# Patient Record
Sex: Female | Born: 1955 | Race: White | Hispanic: No | Marital: Single | Smoking: Former smoker
Health system: Southern US, Community
[De-identification: ages and names within clinical notes are randomized; demographics above are authoritative.]

## PROBLEM LIST (undated history)

## (undated) DIAGNOSIS — R0789 Other chest pain: Secondary | ICD-10-CM

## (undated) DIAGNOSIS — E079 Disorder of thyroid, unspecified: Secondary | ICD-10-CM

## (undated) DIAGNOSIS — F32A Depression, unspecified: Secondary | ICD-10-CM

## (undated) DIAGNOSIS — M5020 Other cervical disc displacement, unspecified cervical region: Secondary | ICD-10-CM

## (undated) DIAGNOSIS — J309 Allergic rhinitis, unspecified: Secondary | ICD-10-CM

## (undated) DIAGNOSIS — E669 Obesity, unspecified: Secondary | ICD-10-CM

## (undated) DIAGNOSIS — R5382 Chronic fatigue, unspecified: Secondary | ICD-10-CM

## (undated) DIAGNOSIS — K9049 Malabsorption due to intolerance, not elsewhere classified: Secondary | ICD-10-CM

## (undated) DIAGNOSIS — G4721 Circadian rhythm sleep disorder, delayed sleep phase type: Secondary | ICD-10-CM

## (undated) DIAGNOSIS — K589 Irritable bowel syndrome without diarrhea: Secondary | ICD-10-CM

## (undated) DIAGNOSIS — K219 Gastro-esophageal reflux disease without esophagitis: Secondary | ICD-10-CM

## (undated) DIAGNOSIS — G9332 Myalgic encephalomyelitis/chronic fatigue syndrome: Secondary | ICD-10-CM

## (undated) DIAGNOSIS — F1910 Other psychoactive substance abuse, uncomplicated: Secondary | ICD-10-CM

## (undated) DIAGNOSIS — J8410 Pulmonary fibrosis, unspecified: Secondary | ICD-10-CM

## (undated) DIAGNOSIS — G4700 Insomnia, unspecified: Secondary | ICD-10-CM

## (undated) DIAGNOSIS — F988 Other specified behavioral and emotional disorders with onset usually occurring in childhood and adolescence: Secondary | ICD-10-CM

## (undated) DIAGNOSIS — I341 Nonrheumatic mitral (valve) prolapse: Secondary | ICD-10-CM

## (undated) DIAGNOSIS — T7840XA Allergy, unspecified, initial encounter: Secondary | ICD-10-CM

## (undated) DIAGNOSIS — J3089 Other allergic rhinitis: Secondary | ICD-10-CM

## (undated) DIAGNOSIS — E7800 Pure hypercholesterolemia, unspecified: Secondary | ICD-10-CM

## (undated) DIAGNOSIS — M25552 Pain in left hip: Secondary | ICD-10-CM

## (undated) DIAGNOSIS — N83209 Unspecified ovarian cyst, unspecified side: Secondary | ICD-10-CM

## (undated) DIAGNOSIS — M1990 Unspecified osteoarthritis, unspecified site: Secondary | ICD-10-CM

## (undated) DIAGNOSIS — R002 Palpitations: Secondary | ICD-10-CM

## (undated) DIAGNOSIS — D649 Anemia, unspecified: Secondary | ICD-10-CM

## (undated) DIAGNOSIS — F329 Major depressive disorder, single episode, unspecified: Secondary | ICD-10-CM

## (undated) DIAGNOSIS — M069 Rheumatoid arthritis, unspecified: Secondary | ICD-10-CM

## (undated) DIAGNOSIS — J45909 Unspecified asthma, uncomplicated: Secondary | ICD-10-CM

## (undated) DIAGNOSIS — A833 St Louis encephalitis: Secondary | ICD-10-CM

## (undated) DIAGNOSIS — M25542 Pain in joints of left hand: Secondary | ICD-10-CM

## (undated) DIAGNOSIS — E785 Hyperlipidemia, unspecified: Secondary | ICD-10-CM

## (undated) DIAGNOSIS — E039 Hypothyroidism, unspecified: Secondary | ICD-10-CM

## (undated) DIAGNOSIS — Z8249 Family history of ischemic heart disease and other diseases of the circulatory system: Secondary | ICD-10-CM

## (undated) DIAGNOSIS — M797 Fibromyalgia: Secondary | ICD-10-CM

## (undated) HISTORY — DX: Malabsorption due to intolerance, not elsewhere classified: K90.49

## (undated) HISTORY — DX: Hyperlipidemia, unspecified: E78.5

## (undated) HISTORY — DX: Circadian rhythm sleep disorder, delayed sleep phase type: G47.21

## (undated) HISTORY — DX: Allergy, unspecified, initial encounter: T78.40XA

## (undated) HISTORY — DX: Pulmonary fibrosis, unspecified: J84.10

## (undated) HISTORY — DX: Family history of ischemic heart disease and other diseases of the circulatory system: Z82.49

## (undated) HISTORY — DX: Irritable bowel syndrome, unspecified: K58.9

## (undated) HISTORY — PX: TONSILLECTOMY AND ADENOIDECTOMY: SUR1326

## (undated) HISTORY — DX: Anemia, unspecified: D64.9

## (undated) HISTORY — DX: Other specified behavioral and emotional disorders with onset usually occurring in childhood and adolescence: F98.8

## (undated) HISTORY — DX: Chronic fatigue, unspecified: R53.82

## (undated) HISTORY — DX: Palpitations: R00.2

## (undated) HISTORY — DX: Other chest pain: R07.89

## (undated) HISTORY — DX: Obesity, unspecified: E66.9

## (undated) HISTORY — DX: Myalgic encephalomyelitis/chronic fatigue syndrome: G93.32

## (undated) HISTORY — DX: Other cervical disc displacement, unspecified cervical region: M50.20

## (undated) HISTORY — DX: Rheumatoid arthritis, unspecified: M06.9

## (undated) HISTORY — DX: Unspecified asthma, uncomplicated: J45.909

## (undated) HISTORY — DX: Other psychoactive substance abuse, uncomplicated: F19.10

## (undated) HISTORY — DX: Disorder of thyroid, unspecified: E07.9

## (undated) HISTORY — DX: Depression, unspecified: F32.A

## (undated) HISTORY — DX: Pain in joints of left hand: M25.542

## (undated) HISTORY — PX: LIPOMA EXCISION: SHX5283

## (undated) HISTORY — DX: Unspecified ovarian cyst, unspecified side: N83.209

## (undated) HISTORY — DX: Fibromyalgia: M79.7

## (undated) HISTORY — DX: Nonrheumatic mitral (valve) prolapse: I34.1

## (undated) HISTORY — DX: Major depressive disorder, single episode, unspecified: F32.9

## (undated) HISTORY — PX: OTHER SURGICAL HISTORY: SHX169

## (undated) HISTORY — DX: Unspecified osteoarthritis, unspecified site: M19.90

## (undated) HISTORY — DX: Gastro-esophageal reflux disease without esophagitis: K21.9

## (undated) HISTORY — DX: Other allergic rhinitis: J30.89

## (undated) HISTORY — DX: St Louis encephalitis: A83.3

## (undated) HISTORY — DX: Pure hypercholesterolemia, unspecified: E78.00

## (undated) HISTORY — DX: Pain in left hip: M25.552

## (undated) HISTORY — DX: Allergic rhinitis, unspecified: J30.9

## (undated) HISTORY — DX: Hypothyroidism, unspecified: E03.9

## (undated) HISTORY — PX: CHOLECYSTECTOMY: SHX55

## (undated) HISTORY — DX: Insomnia, unspecified: G47.00

---

## 1997-11-02 ENCOUNTER — Emergency Department (HOSPITAL_COMMUNITY): Admission: EM | Admit: 1997-11-02 | Discharge: 1997-11-02 | Payer: Self-pay | Admitting: Emergency Medicine

## 1999-05-06 ENCOUNTER — Other Ambulatory Visit: Admission: RE | Admit: 1999-05-06 | Discharge: 1999-05-06 | Payer: Self-pay | Admitting: Family Medicine

## 2001-07-27 ENCOUNTER — Ambulatory Visit (HOSPITAL_COMMUNITY): Admission: RE | Admit: 2001-07-27 | Discharge: 2001-07-27 | Payer: Self-pay | Admitting: Internal Medicine

## 2001-09-26 ENCOUNTER — Other Ambulatory Visit: Admission: RE | Admit: 2001-09-26 | Discharge: 2001-09-26 | Payer: Self-pay | Admitting: Obstetrics and Gynecology

## 2001-10-11 ENCOUNTER — Other Ambulatory Visit: Admission: RE | Admit: 2001-10-11 | Discharge: 2001-10-11 | Payer: Self-pay | Admitting: Obstetrics and Gynecology

## 2002-01-18 ENCOUNTER — Encounter: Admission: RE | Admit: 2002-01-18 | Discharge: 2002-01-18 | Payer: Self-pay | Admitting: Family Medicine

## 2002-01-18 ENCOUNTER — Encounter: Payer: Self-pay | Admitting: Family Medicine

## 2002-02-12 ENCOUNTER — Encounter: Payer: Self-pay | Admitting: Gastroenterology

## 2002-02-12 ENCOUNTER — Encounter: Admission: RE | Admit: 2002-02-12 | Discharge: 2002-02-12 | Payer: Self-pay | Admitting: Gastroenterology

## 2002-02-18 ENCOUNTER — Encounter: Payer: Self-pay | Admitting: Gastroenterology

## 2002-02-18 ENCOUNTER — Encounter: Admission: RE | Admit: 2002-02-18 | Discharge: 2002-02-18 | Payer: Self-pay | Admitting: Gastroenterology

## 2002-04-02 ENCOUNTER — Ambulatory Visit (HOSPITAL_COMMUNITY): Admission: RE | Admit: 2002-04-02 | Discharge: 2002-04-02 | Payer: Self-pay | Admitting: Gastroenterology

## 2003-02-11 ENCOUNTER — Other Ambulatory Visit: Admission: RE | Admit: 2003-02-11 | Discharge: 2003-02-11 | Payer: Self-pay | Admitting: Obstetrics and Gynecology

## 2003-02-26 ENCOUNTER — Ambulatory Visit (HOSPITAL_COMMUNITY): Admission: RE | Admit: 2003-02-26 | Discharge: 2003-02-26 | Payer: Self-pay | Admitting: Internal Medicine

## 2003-04-24 ENCOUNTER — Ambulatory Visit (HOSPITAL_COMMUNITY): Admission: RE | Admit: 2003-04-24 | Discharge: 2003-04-24 | Payer: Self-pay | Admitting: Gastroenterology

## 2003-06-27 ENCOUNTER — Encounter: Admission: RE | Admit: 2003-06-27 | Discharge: 2003-06-27 | Payer: Self-pay | Admitting: Family Medicine

## 2004-01-09 ENCOUNTER — Encounter: Admission: RE | Admit: 2004-01-09 | Discharge: 2004-01-09 | Payer: Self-pay | Admitting: Gastroenterology

## 2004-02-03 ENCOUNTER — Observation Stay (HOSPITAL_COMMUNITY): Admission: RE | Admit: 2004-02-03 | Discharge: 2004-02-04 | Payer: Self-pay | Admitting: Surgery

## 2004-04-26 ENCOUNTER — Ambulatory Visit: Payer: Self-pay | Admitting: Pulmonary Disease

## 2004-06-22 ENCOUNTER — Encounter: Admission: RE | Admit: 2004-06-22 | Discharge: 2004-06-22 | Payer: Self-pay

## 2004-06-28 ENCOUNTER — Ambulatory Visit: Payer: Self-pay | Admitting: Pulmonary Disease

## 2004-10-06 ENCOUNTER — Ambulatory Visit: Payer: Self-pay | Admitting: Pulmonary Disease

## 2004-10-25 ENCOUNTER — Ambulatory Visit: Payer: Self-pay | Admitting: Pulmonary Disease

## 2004-12-10 ENCOUNTER — Ambulatory Visit: Payer: Self-pay | Admitting: Pulmonary Disease

## 2005-05-25 ENCOUNTER — Ambulatory Visit: Payer: Self-pay | Admitting: Internal Medicine

## 2005-06-24 ENCOUNTER — Ambulatory Visit: Payer: Self-pay | Admitting: Pulmonary Disease

## 2005-08-09 ENCOUNTER — Ambulatory Visit: Payer: Self-pay | Admitting: Pulmonary Disease

## 2005-12-22 ENCOUNTER — Ambulatory Visit: Payer: Self-pay | Admitting: Pulmonary Disease

## 2010-06-12 ENCOUNTER — Encounter: Payer: Self-pay | Admitting: Family Medicine

## 2011-11-03 ENCOUNTER — Other Ambulatory Visit: Payer: Self-pay | Admitting: Gynecology

## 2011-11-03 DIAGNOSIS — Z1231 Encounter for screening mammogram for malignant neoplasm of breast: Secondary | ICD-10-CM

## 2011-11-17 ENCOUNTER — Ambulatory Visit
Admission: RE | Admit: 2011-11-17 | Discharge: 2011-11-17 | Disposition: A | Discharge status: Home / Self Care | Payer: Medicare Other | Source: Ambulatory Visit | Attending: Gynecology | Admitting: Gynecology

## 2011-11-17 DIAGNOSIS — Z1231 Encounter for screening mammogram for malignant neoplasm of breast: Secondary | ICD-10-CM

## 2014-01-14 ENCOUNTER — Ambulatory Visit: Payer: Medicare Other | Admitting: Cardiology

## 2014-02-19 ENCOUNTER — Encounter: Payer: Self-pay | Admitting: Cardiology

## 2014-02-19 ENCOUNTER — Ambulatory Visit (INDEPENDENT_AMBULATORY_CARE_PROVIDER_SITE_OTHER): Payer: Medicare Other | Admitting: Cardiology

## 2014-02-19 ENCOUNTER — Ambulatory Visit (INDEPENDENT_AMBULATORY_CARE_PROVIDER_SITE_OTHER)
Admission: RE | Admit: 2014-02-19 | Discharge: 2014-02-19 | Disposition: A | Discharge status: Home / Self Care | Payer: Self-pay | Source: Ambulatory Visit | Attending: Cardiology | Admitting: Cardiology

## 2014-02-19 VITALS — BP 100/80 | HR 84 | Ht 66.0 in | Wt 191.0 lb

## 2014-02-19 DIAGNOSIS — R002 Palpitations: Secondary | ICD-10-CM

## 2014-02-19 DIAGNOSIS — R0789 Other chest pain: Secondary | ICD-10-CM

## 2014-02-19 DIAGNOSIS — Z8249 Family history of ischemic heart disease and other diseases of the circulatory system: Secondary | ICD-10-CM

## 2014-02-19 DIAGNOSIS — E7800 Pure hypercholesterolemia, unspecified: Secondary | ICD-10-CM

## 2014-02-19 DIAGNOSIS — E669 Obesity, unspecified: Secondary | ICD-10-CM

## 2014-02-19 HISTORY — DX: Family history of ischemic heart disease and other diseases of the circulatory system: Z82.49

## 2014-02-19 HISTORY — DX: Other chest pain: R07.89

## 2014-02-19 HISTORY — DX: Obesity, unspecified: E66.9

## 2014-02-19 HISTORY — DX: Pure hypercholesterolemia, unspecified: E78.00

## 2014-02-19 HISTORY — DX: Palpitations: R00.2

## 2014-02-19 NOTE — Patient Instructions (Signed)
The current medical regimen is effective;  continue present plan and medications.  Your physician has requested that you have Calcium score. Computed tomography (CT) is a painless test that uses an x-ray machine to take clear, detailed pictures of your heart. For further information please visit [email or web address]. Please follow instruction sheet as given.  Further follow will be based on these results.

## 2014-02-19 NOTE — Progress Notes (Signed)
[address] [place], [place]  [address] Phone: [phone] Fax:  [phone]  Date:  02/19/2014   ID:  Colon FlatteryKimberly K Munoz, DOB 08-28-1955, MRN [id number]  PCP:  Pearson GrippeKIM, JAMES, MD   History of Present Illness: Theresa Munoz is a 58 y.o. female here for evaluation of strong family history. Her brother had recent myocardial infarction and is undergoing bypass surgery. She has had chest pain off and on for several years. At one point she was diagnosed with mitral valve prolapse syndrome but most recent evaluation did not demonstrate any evidence of mitral valve prolapse. She was also diagnosed previously with esophageal spasm. She does have occasional atypical chest discomfort with short bursts of sharp chest discomfort that lasts a few seconds duration then releases. These do not seem to be associated with exertional activity.  She has been under increased stress recently taking care of her mother who has Alzheimer's. [facility] has [facility]. This is been a challenge.     Wt Readings from Last 3 Encounters:  02/19/14 191 lb (86.637 kg)     No past medical history on file. 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  Current Outpatient Prescriptions  Medication Sig Dispense Refill  . ARMOUR THYROID 30 MG tablet Take 60 mg by mouth daily before breakfast.       . Cholecalciferol (VITAMIN D3) 2000 UNITS capsule Take 2,000 Units by mouth daily.      . CONCERTA 54 MG CR tablet       . diphenhydrAMINE (SOMINEX) 25 MG tablet Take 25 mg by mouth at bedtime as needed for sleep.      Marland Kitchen. estradiol (ESTRACE) 0.1 MG/GM vaginal cream Place 1 Applicatorful vaginally at bedtime.      . fexofenadine (ALLEGRA) 180 MG tablet Take 180 mg by mouth daily.      Marland Kitchen. FLUoxetine (PROZAC) 20 MG capsule Take 40 mg by mouth daily.       Marland Kitchen. GINKGO BILOBA PO Take by mouth. Ginkgo  Biloba with Vinpocetine      . Guaifenesin (MUCINEX MAXIMUM STRENGTH) 1200 MG TB12 Take 1,200 mg by mouth 2 (two) times daily.      Marland Kitchen. L-Methylfolate 15 MG TABS Take 15 mg by mouth daily.      Marland Kitchen. levETIRAcetam (KEPPRA) 750 MG tablet Take 1,500 mg by mouth 2 (two) times daily.       . Magnesium 400 MG TABS Take 500 mg by mouth daily.      . Melatonin 3 MG CAPS Take 3 mg by mouth as needed.      . meloxicam (MOBIC) 7.5 MG tablet Take 7.5 mg by mouth 2 (two) times daily.      . methylphenidate 27 MG PO CR tablet Take 27 mg by mouth daily.      . Misc Natural Products (GRAPE SEED COMPLEX PO) Take by mouth. Grape Seed & Resveratrol      . mometasone (NASONEX) 50 MCG/ACT nasal spray Place 2 sprays into the nose 2 (two) times daily.      . montelukast (SINGULAIR) 10 MG tablet Take 10 mg by mouth as needed.      Marland Kitchen. NEXIUM 40 MG capsule Take 80 mg by mouth 2 (two) times daily before a meal.       . Probiotic Product (ALIGN) 4 MG CAPS Take 4 mg by mouth daily.      .Marland Kitchen  riboflavin (VITAMIN B-2) 100 MG TABS tablet Take 100 mg by mouth daily.      . Tiotropium Bromide Monohydrate (SPIRIVA RESPIMAT) 2.5 MCG/ACT AERS Inhale into the lungs as needed.      . Tretinoin, Facial Wrinkles, (TRETINOIN, EMOLLIENT,) 0.05 % CREA Apply topically.      Marland Kitchen zolpidem (AMBIEN) 10 MG tablet Take 5 mg by mouth at bedtime as needed for sleep.       No current facility-administered medications for this visit.    Allergies:    Allergies  Allergen Reactions  . Polymyxin B   . Trazodone And Nefazodone     Social History:  The patient  reports that she has quit smoking. She does not have any smokeless tobacco history on file.   No family history on file. as described above in history of present illness. Brother MI  ROS:  Please see the history of present illness.   Denies any fevers, chills, orthopnea, PND, syncope, bleeding, exertional chest pain, shortness of breath. She is on several dietary supplementation pills.   All other  systems reviewed and negative.   PHYSICAL EXAM: VS:  BP 100/80  Pulse 84  Ht 5\' 6"  (1.676 m)  Wt 191 lb (86.637 kg)  BMI 30.84 kg/m2 Well nourished, well developed, in no acute distress HEENT: normal, [place]/AT, EOMI Neck: no JVD, normal carotid upstroke, no bruit Cardiac:  normal S1, S2; RRR; soft systolic murmur Lungs:  clear to auscultation bilaterally, no wheezing, rhonchi or rales Abd: soft, nontender, no hepatomegaly, no bruitsoverweight Ext: no edema, 2+ distal pulses Skin: warm and dry GU: deferred Neuro: no focal abnormalities noted, AAO x 3  EKG:  Normal sinus rhythm heart rate 77 with no other abnormalities. Lab work, prior medical records reviewed.  ASSESSMENT AND PLAN:  1. Family history of coronary artery disease-she is concerned especially with her brothers history of bypass surgery today. We will go ahead and provide her with CT coronary calcium score. 2. Hyperlipidemia-total cholesterol currently to 29, HDL 53, LDL 160. Previous LDL 139. TSH is normal at 1.3, hemoglobin 12.7, creatinine 0.9. Cholesterol has increased slightly over the past few months. This is going to be rechecked in 6 months by Dr. Selena Batten. She is not currently in the familial hyperlipidemia range, LDL greater than 190. We discussed at length dietary modifications, exercise. If calcium score is significantly elevated, we may proceed with stress test and/or encouragement of statin therapy. She did relate to me that she has not highly interested in taking statins and as long as she does not have any high risk objective evidence, I am fine with her continuing with her dietary modification. Her current chest discomfort is very atypical. 3. Palpitations-very rare. No complaints currently. 4. We will see her back in one year. I will discuss with her the results of her calcium score.  Signed, Donato Schultz, MD [facility]  02/19/2014 5:16 PM

## 2014-02-20 ENCOUNTER — Telehealth: Payer: Self-pay | Admitting: Interventional Cardiology

## 2014-02-20 ENCOUNTER — Telehealth: Payer: Self-pay | Admitting: Cardiology

## 2014-02-20 NOTE — Telephone Encounter (Signed)
Walk In pt form " [facility]" Dropped Off gave to Amy  10.1.15/km

## 2014-02-20 NOTE — Telephone Encounter (Signed)
Correction the Walk in pt For Not Given to Amy These are For Pam she returns 10/5   10.1.15/km

## 2014-11-27 IMAGING — CT CT HEART SCORING
1 of 3 series · 10 of 20 positions shown, 13 images · non-contrast
Comparison: None.

CLINICAL DATA: Risk stratification

EXAM:
Coronary Calcium Score
TECHNIQUE: The patient was scanned on a Siemens Sensation 16 slice scanner.
Axial non-contrast 3mm slices were carried out through the heart.
The data set was analyzed on a dedicated work station and scored
using the Agatson method.
Axial non-contrast 3 mm slices were carried out through the heart.

[Series 6: st thins for reformat · axial · 0.64mm/px · z∈[-227,-125]mm · 10 of 126 slices shown, 13 images]
[im 12/126  vessel]
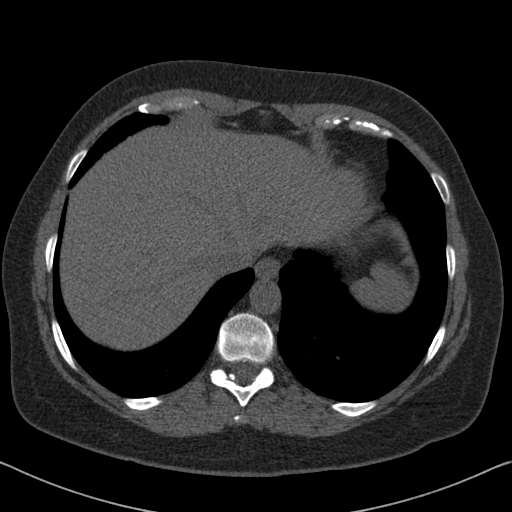
[im 12/126  lung]
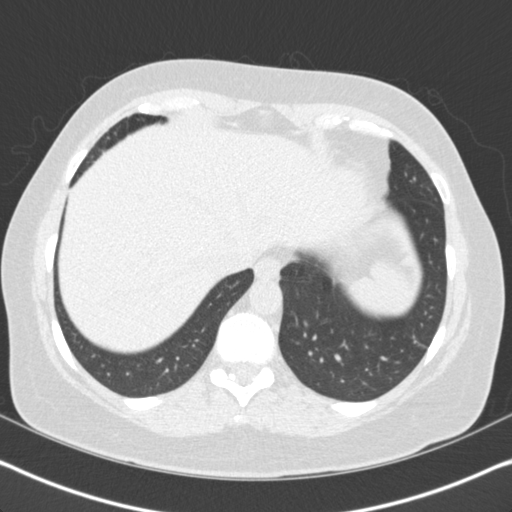
[im 23/126  vessel]
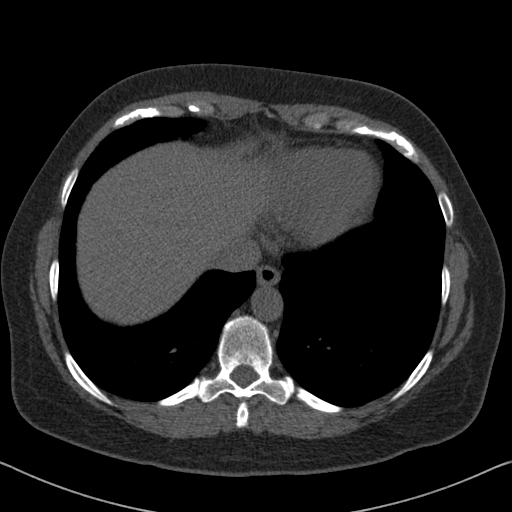
[im 35/126  vessel]
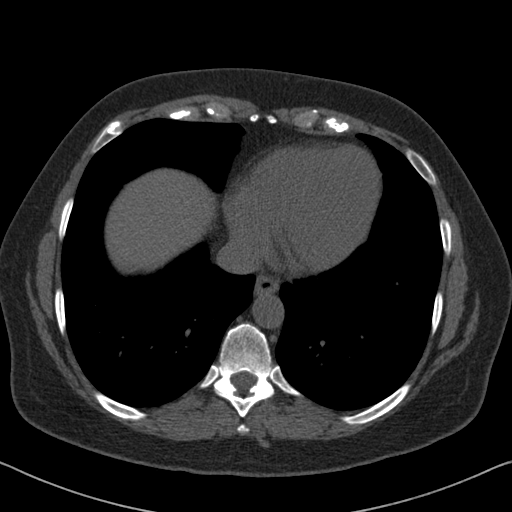
[im 46/126  vessel]
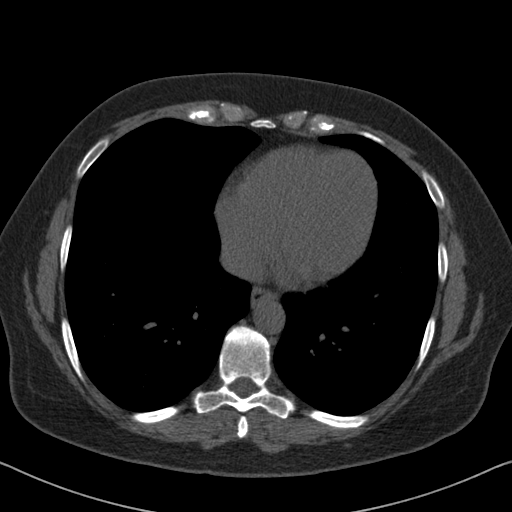
[im 57/126  vessel]
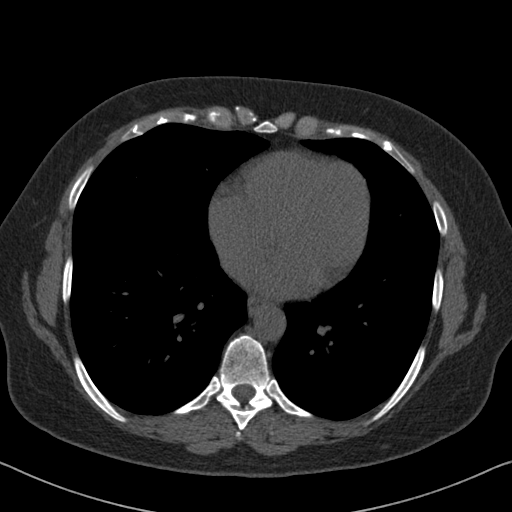
[im 57/126  lung]
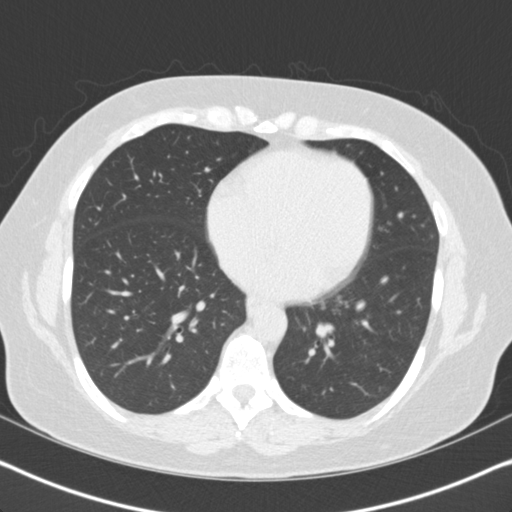
[im 69/126  vessel]
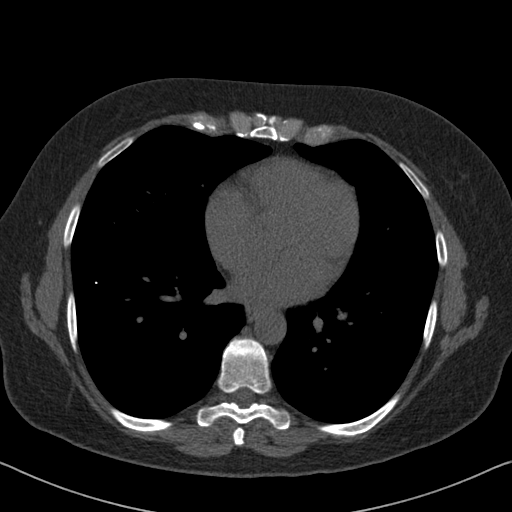
[im 80/126  vessel]
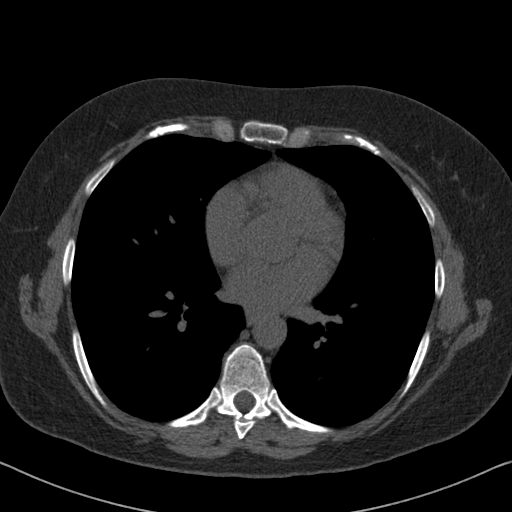
[im 91/126  vessel]
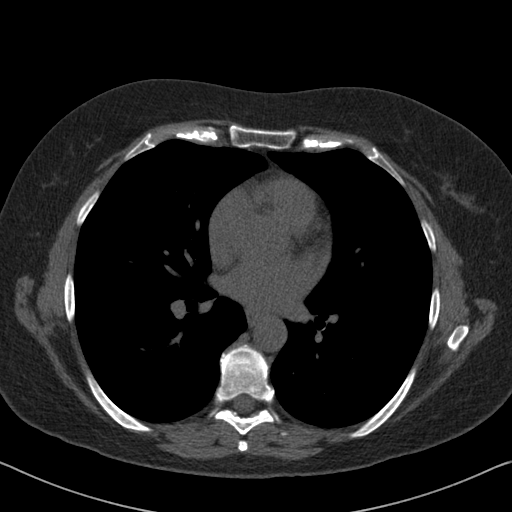
[im 103/126  vessel]
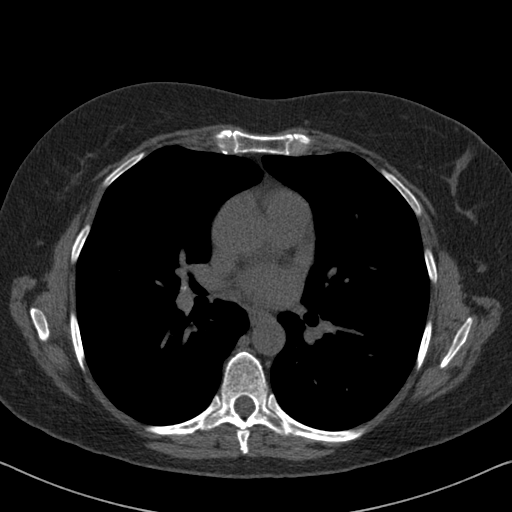
[im 103/126  lung]
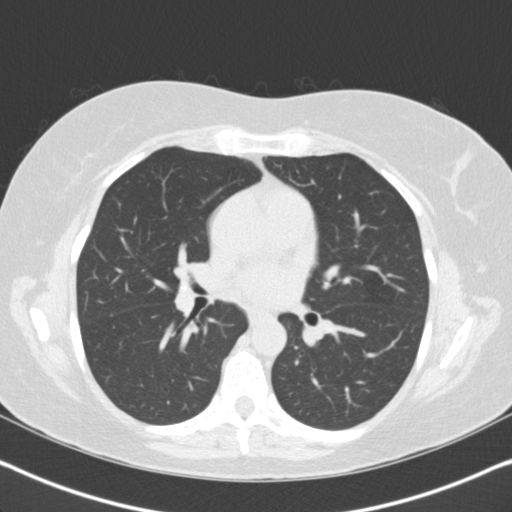
[im 114/126  vessel]
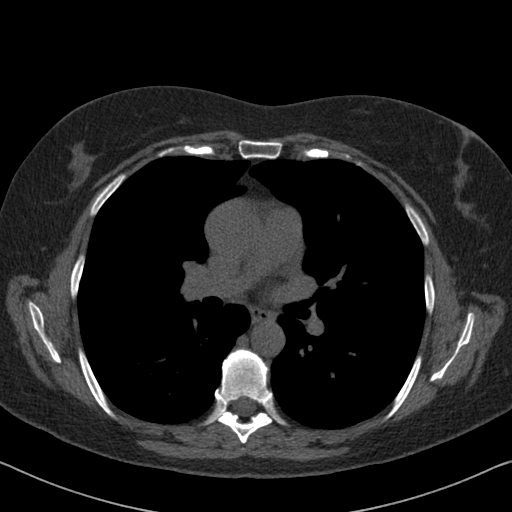

[10 of 20 positions shown; findings below may reference images not displayed]

FINDINGS: Non-cardiac: No significant non cardiac findings on limited lung and
soft tissue windows. See separate report from [REDACTED].

Ascending Aorta:  3.5 cm

Pericardium: Normal

Coronary arteries:  No coronary calcium
IMPRESSION: Coronary calcium score of 0.

[REDACTED]AL DATA:  Risk stratification

EXAM:
Coronary Calcium Score
FINDINGS: Non-cardiac: See separate report from [REDACTED].

Ascending Aorta:  Normal size.

Pericardium:  Normal, no pericardial effusion.

Coronary arteries:  Originating in normal position.
IMPRESSION: Coronary calcium score of 0. This was 0 percentile for age and sex
matched control.

Nisam Bihar

EXAM:
OVER-READ INTERPRETATION  CT CHEST

The following report is an over-read performed by radiologist Dr.
over-read does not include interpretation of cardiac or coronary
anatomy or pathology. The coronary calcium score/coronary CTA
interpretation by the cardiologist is attached.
FINDINGS: Mediastinum: No enlarged mediastinal or hilar lymph nodes. The
visualized portion of the esophagus appear normal.

Lungs/Pleura: Calcified granuloma identified within the right lower
lobe. Noncalcified right lower lobe nodule is noted measure 6 mm. No
evidence for pleural effusion.

Upper Abdomen: The visualized portions of the spleen and liver are
unremarkable.

Musculoskeletal: Review of the visualized osseous structures is
unremarkable
IMPRESSION: 1. Noncalcified nodule within the right lower lobe measures 6 mm. If
the patient is at high risk for bronchogenic carcinoma, follow-up
chest CT at 6-12 months is recommended. If the patient is at low
risk for bronchogenic carcinoma, follow-up chest CT at 12 months is
recommended. This recommendation follows the consensus statement:
Guidelines for Management of Small Pulmonary Nodules Detected on CT
Scans: A Statement from the [HOSPITAL] as published in
2. Prior granulomatous disease.

## 2015-04-01 ENCOUNTER — Ambulatory Visit: Payer: Medicare Other | Attending: Orthopaedic Surgery | Admitting: Physical Therapy

## 2015-04-01 ENCOUNTER — Encounter: Payer: Self-pay | Admitting: Physical Therapy

## 2015-04-01 DIAGNOSIS — M5010 Cervical disc disorder with radiculopathy, unspecified cervical region: Secondary | ICD-10-CM

## 2015-04-01 DIAGNOSIS — M542 Cervicalgia: Secondary | ICD-10-CM

## 2015-04-01 NOTE — Therapy (Signed)
[facility] [address] [place], [place], [address] Phone: [phone]   Fax:  [phone]  Physical Therapy Evaluation  Patient Details  Name: Theresa SchoonerKimberly K Ojala MRN: [id number] Date of Birth: [birth year] Referring Provider: Dr. Loraine LericheMark yates  Encounter Date: 04/01/2015      PT End of Session - 04/01/15 1529    Visit Number 1   Number of Visits 10  Medicare   Date for PT Re-Evaluation 05/13/15   PT Start Time 1500   PT Stop Time 1530   PT Time Calculation (min) 30 min   Activity Tolerance Patient tolerated treatment well   Behavior [facility] for tasks assessed/performed      Past Medical History  Diagnosis Date  . Allergy   . Thyroid disease   . Asthma   . Anemia   . Depression   . Fibromyalgia     History reviewed. No pertinent past surgical history.  There were no vitals filed for this visit.  Visit Diagnosis:  Cervical pain (neck) - Plan: PT plan of care cert/re-cert  Cervical disc disorder with radiculopathy of cervical region - Plan: PT plan of care cert/re-cert      Subjective Assessment - 04/01/15 1507    Subjective Patient reports in 1998 fell and herniated disc in neck. Patient has had chronic neck pain since then. Patient reports last 3 months she is having a flare-up in cervical.  Patient reports radiating pain in left hand and some in right.    Limitations Sitting   How long can you sit comfortably? 30 min.    Diagnostic tests x-ray showed spondylosis with osteophyte   Currently in Pain? Yes   Pain Score 4    Pain Location Neck   Pain Orientation Left   Pain Descriptors / Indicators Spasm   Pain Type Chronic pain   Pain Radiating Towards radiates into left arm, numbness/tingling both hands   Pain Onset More than a month ago   Pain Frequency Intermittent   Aggravating Factors  driving; sitting on a hard chair, looking upward/downward   Pain Relieving Factors medication   Multiple Pain Sites No            [facility] PT Assessment - 04/01/15 0001    Assessment   Medical Diagnosis Cervical spondylosis, neck pain, left UE radiculopathy   Referring Provider Dr. Loraine LericheMark yates   Onset Date/Surgical Date 01/22/15   Hand Dominance Right   Prior Therapy yes-traction  over the door traction unit increased her TMJ pain   Precautions   Precautions None   Balance Screen   Has the patient fallen in the past 6 months No   Has the patient had a decrease in activity level because of a fear of falling?  No   Is the patient reluctant to leave their home because of a fear of falling?  No   Prior Function   Level of Independence Independent with basic ADLs   Cognition   Overall Cognitive Status Within Functional Limits for tasks assessed   Observation/Other Assessments   Focus on Therapeutic Outcomes (FOTO)  53% limitation CK  goal is 42% limitation CK   ROM / Strength   AROM / PROM / Strength AROM   AROM   AROM Assessment Site Cervical   Cervical Flexion full   Cervical Extension decreased by 75%   Cervical - Right Side Bend decreased by 50%   Cervical - Left Side Bend decreased by 50%  Cervical - Right Rotation decreased by 25%   Cervical - Left Rotation decreased by 25%   Palpation   SI assessment  decreased mobility of C3-C7   Palpation comment palpable tenderness located in cervical paraspinals, bil. upper trapezius, bil. scalenes, anterior cervical musculature                             PT Short Term Goals - 2015/04/27 1524    PT SHORT TERM GOAL #1   Title mild exercise for 10 min 3 days per week   Time 3   Period Weeks   Status New   PT SHORT TERM GOAL #2   Title sit for 30 min in a car to travel with moderate pain   Time 3   Period Weeks   Status New           PT Long Term Goals - 04/27/2015 1521    PT LONG TERM GOAL #1   Title independent with HEP    Time 6   Period Weeks   Status New   PT LONG TERM GOAL #2   Title  sit in a car to travel for 1 hour with moderate pain   Time 6   Status New   PT LONG TERM GOAL #3   Title understand ways to manage her cervical pain with correct posture   Time 6   Period Weeks   Status New   PT LONG TERM GOAL #4   Title return to regulary mild exercise for 3 days per week for 20 min   Time 6   Period Weeks   Status New   PT LONG TERM GOAL #5   Title pain decreased to moderate consistently withdaily activities   Time 6   Period Weeks   Status New               Plan - 2015-04-27 1609    Clinical Impression Statement Patient is a 59 year old  female with diagnosis of cervical spondylosis, Neck pain, left UE radiculopathy.  Patient has a history of chronic fatique syndrome and fibromyalgia.  FOTO score is 53% limitation.  Cervical ROM deficits: bilateral rotation decreased by 25%, bilateral sidebending decreased by 50%, and extesnion decreased by 25%.  Palpable tenderness located in scalenes, cervical musculature, and suboccipitals.  decreased mobilty of C3-T1.  Patient reports her cervical pain is 4/10 that radiated inot left UE and tingling in bilateral hands.     Pt will benefit from skilled therapeutic intervention in order to improve on the following deficits Decreased range of motion;Increased fascial restricitons;Pain;Increased muscle spasms;Decreased endurance;Decreased activity tolerance;Impaired flexibility;Decreased mobility;Decreased strength   Rehab Potential Good   Clinical Impairments Affecting Rehab Potential None   PT Frequency 2x / week   PT Duration 6 weeks   PT Treatment/Interventions Other (comment);ADLs/Self Care Home Management;Cryotherapy;Electrical Stimulation;Moist Heat;Therapeutic exercise;Therapeutic activities;Ultrasound;Neuromuscular re-education;Patient/family education;Manual techniques;Passive range of motion;Traction  home TENS unit,    PT Next Visit Plan cervical traction, soft tissue work, modalities, posture   PT Home Exercise  Plan Cervical ROM exercises   Recommended Other Services None   Consulted and Agree with Plan of Care Patient          G-Codes - Apr 27, 2015 1530    Functional Assessment Tool Used FOTO 53% limitation CK  goal is 42% limitation CK   Functional Limitation Changing and maintaining body position   Changing and Maintaining Body Position Current Status (Z6109)  At least 40 percent but less than 60 percent impaired, limited or restricted   Changing and Maintaining Body Position Goal Status (Z6109) At least 40 percent but less than 60 percent impaired, limited or restricted       Problem List Patient Active Problem List   Diagnosis Date Noted  . Atypical chest pain 02/19/2014  . Family history of ischemic heart disease 02/19/2014  . Pure hypercholesterolemia 02/19/2014  . Palpitations 02/19/2014  . Obesity, unspecified 02/19/2014    Kees Idrovo,PT 04/01/2015, 4:16 PM  [facility] [address], [address] [place], [place], [address] Phone: [phone]   Fax:  [phone]  Name: JERIANNE ANSELMO MRN: [id number] Date of Birth: 07/23/1955

## 2015-04-02 ENCOUNTER — Encounter: Payer: Self-pay | Admitting: Physical Therapy

## 2015-04-02 ENCOUNTER — Ambulatory Visit: Payer: Medicare Other | Admitting: Physical Therapy

## 2015-04-02 DIAGNOSIS — M542 Cervicalgia: Secondary | ICD-10-CM | POA: Diagnosis not present

## 2015-04-02 DIAGNOSIS — M5010 Cervical disc disorder with radiculopathy, unspecified cervical region: Secondary | ICD-10-CM

## 2015-04-02 NOTE — Patient Instructions (Signed)
AROM: Neck Rotation    Turn head slowly to look over one shoulder, then the other. Hold each position _5___ seconds. Repeat ___3_ times per set. Do __1__ sets per session. Do 2____ sessions per day. Keep chest upward http://orth.exer.us/294   Copyright  VHI. All rights reserved.  AROM: Lateral Neck Flexion    Slowly tilt head toward one shoulder, then the other. Hold each position __5__ seconds. Repeat _3___ times per set. Do __1__ sets per session. Do __2__ sessions per day.  http://orth.exer.us/296   Copyright  VHI. All rights reserved.  AROM: Neck Extension    Bend head backward. Hold __2__ seconds. Repeat __3__ times per set. Do __1__ sets per session. Do __2__ sessions per day.  http://orth.exer.us/300   Copyright  VHI. All rights reserved.  [facility] [address], [address] [place], [place] [address] Phone # [phone] Fax [phone]

## 2015-04-02 NOTE — Therapy (Signed)
[facility] [address], [address] [place], [place], [address] Phone: [phone]   Fax:  [phone]  Physical Therapy Treatment  Patient Details  Name: Theresa Munoz MRN: [id number] Date of Birth: Jun 12, 1955 Referring Provider: Dr. Loraine Leriche yates  Encounter Date: 04/02/2015      PT End of Session - 04/02/15 1420    Visit Number 2   Number of Visits 10  Medicare   Date for PT Re-Evaluation 05/13/15   PT Start Time 1400   PT Stop Time 1445   PT Time Calculation (min) 45 min   Activity Tolerance Patient tolerated treatment well   Behavior During Therapy [facility] for tasks assessed/performed      Past Medical History  Diagnosis Date  . Allergy   . Thyroid disease   . Asthma   . Anemia   . Depression   . Fibromyalgia     History reviewed. No pertinent past surgical history.  There were no vitals filed for this visit.  Visit Diagnosis:  Cervical pain (neck)  Cervical disc disorder with radiculopathy of cervical region      Subjective Assessment - 04/02/15 1404    Subjective I felt the same after therapy.  I have more pain in the evening.    Limitations Sitting   How long can you sit comfortably? 30 min.    Diagnostic tests x-ray showed spondylosis with osteophyte   Currently in Pain? Yes   Pain Score 4    Pain Location Neck   Pain Orientation Left   Pain Descriptors / Indicators Spasm   Pain Type Chronic pain   Pain Radiating Towards radiates inot left arm, numbness/tingling both hands   Pain Onset More than a month ago   Pain Frequency Intermittent   Aggravating Factors  driving, sitting on a hard chair, looking upward/downward   Pain Relieving Factors medication   Multiple Pain Sites No                         [facility] Adult PT Treatment/Exercise - 04/02/15 0001    Modalities   Modalities Traction   Traction   Type of Traction Cervical   Min (lbs) 5   Max (lbs) 10   Time 10   Manual  Therapy   Manual Therapy Soft tissue mobilization                PT Education - 04/02/15 1419    Education provided Yes   Education Details cervical ROM   Person(s) Educated Patient   Methods Explanation;Demonstration;Verbal cues;Handout   Comprehension Returned demonstration;Verbalized understanding          PT Short Term Goals - 04/01/15 1524    PT SHORT TERM GOAL #1   Title mild exercise for 10 min 3 days per week   Time 3   Period Weeks   Status New   PT SHORT TERM GOAL #2   Title sit for 30 min in a car to travel with moderate pain   Time 3   Period Weeks   Status New           PT Long Term Goals - 04/01/15 1521    PT LONG TERM GOAL #1   Title independent with HEP    Time 6   Period Weeks   Status New   PT LONG TERM GOAL #2   Title sit in a car to travel for 1 hour with moderate pain  Time 6   Status New   PT LONG TERM GOAL #3   Title understand ways to manage her cervical pain with correct posture   Time 6   Period Weeks   Status New   PT LONG TERM GOAL #4   Title return to regulary mild exercise for 3 days per week for 20 min   Time 6   Period Weeks   Status New   PT LONG TERM GOAL #5   Title pain decreased to moderate consistently withdaily activities   Time 6   Period Weeks   Status New               Plan - 04/02/15 1420    Clinical Impression Statement Patient is a 59 year old female with diagnosis of cervical spondylosis, Neck pain, left UE radiculopathy. Patient is having her first treatment therefore has not met goals.  Patient is trying cervical traction to see if it is beneficial.  Patient has tightness ini pectalis and upper traps and scalenes contributing ot her forward head posture. Patient would benefit from physicl therapy to improve ROM and decresae pain.    Pt will benefit from skilled therapeutic intervention in order to improve on the following deficits Decreased range of motion;Increased fascial  restricitons;Pain;Increased muscle spasms;Decreased endurance;Decreased activity tolerance;Impaired flexibility;Decreased mobility;Decreased strength   Rehab Potential Good   Clinical Impairments Affecting Rehab Potential None   PT Frequency 2x / week   PT Treatment/Interventions Other (comment);ADLs/Self Care Home Management;Cryotherapy;Electrical Stimulation;Moist Heat;Therapeutic exercise;Therapeutic activities;Ultrasound;Neuromuscular re-education;Patient/family education;Manual techniques;Passive range of motion;Traction  Home TENS unit   PT Next Visit Plan see what cervical traction does, soft tissue work, modalities, posture   PT Home Exercise Plan upper trap and scalene stretch   Consulted and Agree with Plan of Care Patient          G-Codes - 2015-04-28 1530    Functional Assessment Tool Used FOTO 53% limitation CK  goal is 42% limitation CK   Functional Limitation Changing and maintaining body position   Changing and Maintaining Body Position Current Status (G6269) At least 40 percent but less than 60 percent impaired, limited or restricted   Changing and Maintaining Body Position Goal Status (S8546) At least 40 percent but less than 60 percent impaired, limited or restricted      Problem List Patient Active Problem List   Diagnosis Date Noted  . Atypical chest pain 02/19/2014  . Family history of ischemic heart disease 02/19/2014  . Pure hypercholesterolemia 02/19/2014  . Palpitations 02/19/2014  . Obesity, unspecified 02/19/2014    Ayuub Penley,PT 04/02/2015, 3:53 PM  [facility] [address] [place], [place], [address] Phone: [phone]   Fax:  [phone]  Name: Theresa Munoz MRN: [id number] Date of Birth: 26-Feb-1956

## 2015-04-06 ENCOUNTER — Ambulatory Visit: Payer: Medicare Other | Admitting: Physical Therapy

## 2015-04-14 ENCOUNTER — Ambulatory Visit: Payer: Medicare Other | Admitting: Rehabilitation

## 2015-04-14 DIAGNOSIS — M542 Cervicalgia: Secondary | ICD-10-CM

## 2015-04-14 DIAGNOSIS — M5010 Cervical disc disorder with radiculopathy, unspecified cervical region: Secondary | ICD-10-CM

## 2015-04-14 NOTE — Therapy (Signed)
[facility] [address], [address] [place], [place], [address] Phone: [phone]   Fax:  [phone]  Physical Therapy Treatment  Patient Details  Name: Theresa Munoz MRN: [id number] Date of Birth: [birth year] Referring Provider: Dr. Loraine LericheMark yates  Encounter Date: 04/14/2015      PT End of Session - 04/14/15 1657    Visit Number 3   Number of Visits 10   Date for PT Re-Evaluation 05/13/15   PT Start Time 1530   PT Stop Time 1620   PT Time Calculation (min) 50 min   Activity Tolerance Patient tolerated treatment well      Past Medical History  Diagnosis Date  . Allergy   . Thyroid disease   . Asthma   . Anemia   . Depression   . Fibromyalgia     No past surgical history on file.  There were no vitals filed for this visit.  Visit Diagnosis:  Cervical pain (neck)  Cervical disc disorder with radiculopathy of cervical region      Subjective Assessment - 04/14/15 1529    Subjective A little more sore  than the last visit. Interested in home traction unit   Currently in Pain? Yes   Pain Score 5    Pain Location Neck   Pain Orientation Left   Pain Descriptors / Indicators Aching;Burning   Pain Type Chronic pain   Pain Radiating Towards into L deltoid   Aggravating Factors  driving, sitting on a hard chair, looking upward   Pain Relieving Factors medication                         [facility] Adult PT Treatment/Exercise - 04/14/15 0001    Exercises   Exercises Neck   Neck Exercises: Seated   Neck Retraction 10 reps   Neck Retraction Limitations 2 sets, tcs for completion   Other Seated Exercise row red band x 15   Other Seated Exercise L UT stretch 3x20"    Modalities   Modalities Traction   Traction   Type of Traction Cervical   Min (lbs) 5   Max (lbs) 13   Time 15   Manual Therapy   Manual Therapy Soft tissue mobilization   Soft tissue mobilization bil UT, LS, scalenes, cervical  paraspinals and pectoralis.  PROM cervical all directions, manual stretches UT, pectoralis   Neck Exercises: Stretches   Corner Stretch 2 reps;30 seconds   Corner Stretch Limitations doorway                PT Education - 04/14/15 1657    Education provided Yes   Education Details updated HEP   Person(s) Educated Patient   Methods Explanation;Demonstration;Handout   Comprehension Verbalized understanding;Returned demonstration;Verbal cues required;Tactile cues required          PT Short Term Goals - 04/01/15 1524    PT SHORT TERM GOAL #1   Title mild exercise for 10 min 3 days per week   Time 3   Period Weeks   Status New   PT SHORT TERM GOAL #2   Title sit for 30 min in a car to travel with moderate pain   Time 3   Period Weeks   Status New           PT Long Term Goals - 04/01/15 1521    PT LONG TERM GOAL #1   Title independent with HEP    Time 6  Period Weeks   Status New   PT LONG TERM GOAL #2   Title sit in a car to travel for 1 hour with moderate pain   Time 6   Status New   PT LONG TERM GOAL #3   Title understand ways to manage her cervical pain with correct posture   Time 6   Period Weeks   Status New   PT LONG TERM GOAL #4   Title return to regulary mild exercise for 3 days per week for 20 min   Time 6   Period Weeks   Status New   PT LONG TERM GOAL #5   Title pain decreased to moderate consistently withdaily activities   Time 6   Period Weeks   Status New               Plan - 04/14/15 1657    Clinical Impression Statement tolerated all new exercises well.  added to HEP.  no increased pain or radicular pain with activities.  tolerated increased traction well.  pt is interested in home cervical traction unit.     PT Next Visit Plan  cervical traction does, soft tissue work, modalities, posture; FR work         Problem List Patient Active Problem List   Diagnosis Date Noted  . Atypical chest pain 02/19/2014  . Family  history of ischemic heart disease 02/19/2014  . Pure hypercholesterolemia 02/19/2014  . Palpitations 02/19/2014  . Obesity, unspecified 02/19/2014    Idamae Lusher, DPT, CMP 04/14/2015, 5:00 PM  [facility] [address], [address] [place], [place], [address] Phone: [phone]   Fax:  [phone]  Name: Theresa Munoz MRN: [id number] Date of Birth: March 19, 1956

## 2015-04-23 ENCOUNTER — Ambulatory Visit: Payer: Medicare Other | Attending: Orthopaedic Surgery | Admitting: Physical Therapy

## 2015-04-23 ENCOUNTER — Encounter: Payer: Self-pay | Admitting: Physical Therapy

## 2015-04-23 DIAGNOSIS — M542 Cervicalgia: Secondary | ICD-10-CM | POA: Diagnosis not present

## 2015-04-23 DIAGNOSIS — M5010 Cervical disc disorder with radiculopathy, unspecified cervical region: Secondary | ICD-10-CM | POA: Diagnosis present

## 2015-04-23 NOTE — Therapy (Signed)
[facility] [address] [place], [place], [address] Phone: [phone]   Fax:  [phone]  Physical Therapy Treatment  Patient Details  Name: Theresa Munoz MRN: [id number] Date of Birth: 1955-12-08 Referring Provider: Dr. Loraine Leriche yates  Encounter Date: 04/23/2015      PT End of Session - 04/23/15 1406    Visit Number 4   Number of Visits 10  medicare   Date for PT Re-Evaluation 05/13/15   PT Start Time 1400   PT Stop Time 1450   PT Time Calculation (min) 50 min   Activity Tolerance Patient tolerated treatment well   Behavior During Therapy [facility] for tasks assessed/performed      Past Medical History  Diagnosis Date  . Allergy   . Thyroid disease   . Asthma   . Anemia   . Depression   . Fibromyalgia     History reviewed. No pertinent past surgical history.  There were no vitals filed for this visit.  Visit Diagnosis:  Cervical pain (neck)  Cervical disc disorder with radiculopathy of cervical region      Subjective Assessment - 04/23/15 1406    Subjective I had to cancel due to stomach virus and had to help my mom. I felt better after the first visit.    Limitations Sitting   How long can you sit comfortably? 30 min.    Diagnostic tests x-ray showed spondylosis with osteophyte            [facility] PT Assessment - 04/23/15 0001    AROM   Cervical Extension decreased by 75%   Cervical - Right Side Bend decreased by 50%   Cervical - Left Side Bend decreased by 50%   Cervical - Right Rotation decreased by 10%   Cervical - Left Rotation decreased by 10%                     [facility] Adult PT Treatment/Exercise - 04/23/15 0001    Traction   Type of Traction Cervical   Min (lbs) 5   Max (lbs) 13  start at 10 then gradually move to 13#   Time 15   Manual Therapy   Manual Therapy Joint mobilization;Soft tissue mobilization   Manual therapy comments contract relax to bil. SB and rot.  with contract relax   Joint Mobilization sidegide to C3-C7 bil. grade 3   Soft tissue mobilization bil UT, LS, scalenes, cervical paraspinals and pectoralis.  PROM cervical all directions, manual stretches UT, pectoralis                PT Education - 04/23/15 1436    Education provided No          PT Short Term Goals - 04/23/15 1437    PT SHORT TERM GOAL #1   Title mild exercise for 10 min 3 days per week   Time 3   Period Weeks   Status On-going   PT SHORT TERM GOAL #2   Title sit for 30 min in a car to travel with moderate pain   Time 3   Period Weeks   Status On-going           PT Long Term Goals - 04/01/15 1521    PT LONG TERM GOAL #1   Title independent with HEP    Time 6   Period Weeks   Status New   PT LONG TERM GOAL #2   Title sit in  a car to travel for 1 hour with moderate pain   Time 6   Status New   PT LONG TERM GOAL #3   Title understand ways to manage her cervical pain with correct posture   Time 6   Period Weeks   Status New   PT LONG TERM GOAL #4   Title return to regulary mild exercise for 3 days per week for 20 min   Time 6   Period Weeks   Status New   PT LONG TERM GOAL #5   Title pain decreased to moderate consistently withdaily activities   Time 6   Period Weeks   Status New               Plan - 04/23/15 1437    Clinical Impression Statement Patient is a 59 year old female with diagnosis with cervical spondylosis, neck pain and UE radiculopathy.  Patient reports radiculopathy decreased by 50%. Cervical pain was doing well after the first 2 treatments but now not as good. Patient has trouble with HEP due ot pain. After therapy patient was able to show inceased in cervical ROM. Patient will benefit from physical therapy to reduce pain.    Pt will benefit from skilled therapeutic intervention in order to improve on the following deficits Decreased range of motion;Increased fascial restricitons;Pain;Increased muscle  spasms;Decreased endurance;Decreased activity tolerance;Impaired flexibility;Decreased mobility;Decreased strength   Rehab Potential Good   Clinical Impairments Affecting Rehab Potential None   PT Frequency 2x / week   PT Duration 6 weeks   PT Treatment/Interventions Other (comment);ADLs/Self Care Home Management;Cryotherapy;Electrical Stimulation;Moist Heat;Therapeutic exercise;Therapeutic activities;Ultrasound;Neuromuscular re-education;Patient/family education;Manual techniques;Passive range of motion;Traction  Home TENS unit   PT Next Visit Plan  cervical traction does, soft tissue work, modalities, posture; FR work    PT Home Exercise Plan review past HEP   Consulted and Agree with Plan of Care Patient        Problem List Patient Active Problem List   Diagnosis Date Noted  . Atypical chest pain 02/19/2014  . Family history of ischemic heart disease 02/19/2014  . Pure hypercholesterolemia 02/19/2014  . Palpitations 02/19/2014  . Obesity, unspecified 02/19/2014    GRAY,CHERYL,PT 04/23/2015, 2:44 PM  [facility] [address], [address] [place], [place], [address] Phone: [phone]   Fax:  [phone]  Name: Theresa Munoz MRN: [id number] Date of Birth: 17-Jan-1956

## 2015-04-28 ENCOUNTER — Ambulatory Visit: Payer: Medicare Other | Admitting: Physical Therapy

## 2015-04-30 ENCOUNTER — Encounter: Payer: Medicare Other | Admitting: Physical Therapy

## 2015-05-04 ENCOUNTER — Encounter: Payer: Medicare Other | Admitting: Physical Therapy

## 2015-05-07 ENCOUNTER — Encounter: Payer: Medicare Other | Admitting: Physical Therapy

## 2015-05-12 ENCOUNTER — Ambulatory Visit: Payer: Medicare Other | Admitting: Physical Therapy

## 2015-05-14 ENCOUNTER — Ambulatory Visit: Payer: Medicare Other | Admitting: Physical Therapy

## 2015-05-28 ENCOUNTER — Ambulatory Visit: Payer: Medicare Other | Attending: Orthopaedic Surgery | Admitting: Physical Therapy

## 2015-05-28 ENCOUNTER — Encounter: Payer: Self-pay | Admitting: Physical Therapy

## 2015-05-28 DIAGNOSIS — M5010 Cervical disc disorder with radiculopathy, unspecified cervical region: Secondary | ICD-10-CM | POA: Diagnosis present

## 2015-05-28 DIAGNOSIS — M542 Cervicalgia: Secondary | ICD-10-CM | POA: Diagnosis present

## 2015-05-28 NOTE — Therapy (Signed)
[facility] [address] [place], [place], [address] Phone: [phone]   Fax:  [phone]  Physical Therapy Treatment  Patient Details  Name: Theresa Munoz MRN: [id number] Date of Birth: 1956/04/30 Referring Provider: Dr. Rodell Perna  Encounter Date: 05/28/2015      PT End of Session - 05/28/15 1521    Visit Number 5   Number of Visits 10  Medicare   Date for PT Re-Evaluation 07/08/15   PT Start Time 7628   PT Stop Time 1535   PT Time Calculation (min) 50 min   Activity Tolerance Patient tolerated treatment well   Behavior During Therapy [facility] for tasks assessed/performed      Past Medical History  Diagnosis Date  . Allergy   . Thyroid disease   . Asthma   . Anemia   . Depression   . Fibromyalgia     History reviewed. No pertinent past surgical history.  There were no vitals filed for this visit.  Visit Diagnosis:  Cervical pain (neck) - Plan: PT plan of care cert/re-cert  Cervical disc disorder with radiculopathy of cervical region - Plan: PT plan of care cert/re-cert      Subjective Assessment - 05/28/15 1450    Subjective My neck and shoulder is feeling better. I feeling 75% - 80% better. I have not been here in [employer] eto feeling sick, the holidays and taking care of family members.    Limitations Sitting   How long can you sit comfortably? 30 min.    Diagnostic tests x-ray showed spondylosis with osteophyte   Patient Stated Goals I have trouble tucking my chin down.    Currently in Pain? Yes   Pain Score 4    Pain Location Neck  into left shoulder   Pain Orientation Left   Pain Descriptors / Indicators Sharp;Stabbing   Pain Type Chronic pain   Pain Onset More than a month ago   Pain Frequency Intermittent   Aggravating Factors  driving   Pain Relieving Factors sleeping   Multiple Pain Sites No            [facility] PT Assessment - 05/28/15 0001    Assessment   Medical Diagnosis  Cervical spondylosis, neck pain, left UE radiculopathy   Referring Provider Dr. Rodell Perna   Onset Date/Surgical Date 01/22/15   Hand Dominance Right   Prior Therapy yes-traction  over the door traction unit increased her TMJ pain   Precautions   Precautions None   Balance Screen   Has the patient fallen in the past 6 months No   Has the patient had a decrease in activity level because of a fear of falling?  No   Is the patient reluctant to leave their home because of a fear of falling?  No   Prior Function   Level of Independence Independent with basic ADLs   Cognition   Overall Cognitive Status Within Functional Limits for tasks assessed   Observation/Other Assessments   Focus on Therapeutic Outcomes (FOTO)  52% limitation   ROM / Strength   AROM / PROM / Strength AROM   AROM   AROM Assessment Site Cervical   Cervical Flexion full   Cervical Extension decreased by 50%   Cervical - Right Side Bend decreased by 25%   Cervical - Left Side Bend decreased by 25%   Cervical - Right Rotation decreased by 10%   Cervical - Left Rotation decreased by 10%  [place] Adult PT Treatment/Exercise - 05/28/15 0001    Neck Exercises: Standing   Other Standing Exercises scapula rectraction and extension 10x bil. 15 each   Neck Exercises: Seated   Other Seated Exercise manually stretche bil. scalens adn upper trapezius; chin retraction   Traction   Type of Traction Cervical   Min (lbs) 5   Max (lbs) 13   Time 15   Manual Therapy   Manual Therapy Soft tissue mobilization   Soft tissue mobilization bil. cervical paraspinals, upper trapezius, and suboccipital                  PT Short Term Goals - 05/28/15 1452    PT SHORT TERM GOAL #1   Title mild exercise for 10 min 3 days per week   Time 3   Period Weeks   Status Achieved   PT SHORT TERM GOAL #2   Title sit for 30 min in a car to travel with moderate pain   Time 3   Period Weeks   Status  New           PT Long Term Goals - 05/28/15 1453    PT LONG TERM GOAL #1   Title independent with HEP    Time 6   Period Weeks   Status On-going  still learning exercises   PT LONG TERM GOAL #2   Title sit in a car to travel for 1 hour with moderate pain   Time 6   Status On-going   PT LONG TERM GOAL #3   Title understand ways to manage her cervical pain with correct posture   Time 6   Period Weeks   Status Achieved   PT LONG TERM GOAL #4   Title return to regulary mild exercise for 3 days per week for 20 min   Time 6   Period Weeks   Status On-going   PT LONG TERM GOAL #5   Title pain decreased to moderate consistently withdaily activities   Time 6   Period Weeks   Status On-going      Progress is from 03/26/2015 to 05/28/2015. Renewal note done.          Plan - 05/28/15 1523    Clinical Impression Statement Patient is a 60 year old female with diagnosis of cervical spondylosis, neck pain, and UE radiculopathy.  The  tingling in left UE has decreased by 90%.  Patient reports her cervical pain decreased by 75% but her drive from [place] caused a new pain in her cervical. Patient cervical ROM has improved by 25%. FOTO score has improved to 52%.  Patient has met her STG's.  Patient would benefit from physical therapy to reduce pain and imporve cervical ROM.    Pt will benefit from skilled therapeutic intervention in order to improve on the following deficits Decreased range of motion;Increased fascial restricitons;Pain;Increased muscle spasms;Decreased endurance;Decreased activity tolerance;Impaired flexibility;Decreased mobility;Decreased strength   Rehab Potential Good   Clinical Impairments Affecting Rehab Potential None   PT Frequency 2x / week   PT Duration 6 weeks   PT Treatment/Interventions Other (comment);ADLs/Self Care Home Management;Cryotherapy;Electrical Stimulation;Moist Heat;Therapeutic exercise;Therapeutic activities;Ultrasound;Neuromuscular  re-education;Patient/family education;Manual techniques;Passive range of motion;Traction  HOME TENS    PT Next Visit Plan  cervical traction does, soft tissue work,    PT Home Exercise Plan progress as needed   Consulted and Agree with Plan of Care Patient        Problem List Patient Active Problem List  Diagnosis Date Noted  . Atypical chest pain 02/19/2014  . Family history of ischemic heart disease 02/19/2014  . Pure hypercholesterolemia 02/19/2014  . Palpitations 02/19/2014  . Obesity, unspecified 02/19/2014    Earlie Counts, PT 05/28/2015 3:30 PM   [facility] [address] [place], [place], [address] Phone: [phone]   Fax:  [phone]  Name: JUSTYN BOYSON MRN: [id number] Date of Birth: 25-Mar-1956

## 2015-06-01 ENCOUNTER — Encounter: Payer: Medicare Other | Admitting: Physical Therapy

## 2015-06-03 ENCOUNTER — Ambulatory Visit: Payer: Medicare Other | Admitting: Physical Therapy

## 2015-06-03 ENCOUNTER — Encounter: Payer: Self-pay | Admitting: Physical Therapy

## 2015-06-03 DIAGNOSIS — M5010 Cervical disc disorder with radiculopathy, unspecified cervical region: Secondary | ICD-10-CM

## 2015-06-03 DIAGNOSIS — M542 Cervicalgia: Secondary | ICD-10-CM | POA: Diagnosis not present

## 2015-06-03 NOTE — Therapy (Signed)
[facility] [address] [place], [place], [address] Phone: [phone]   Fax:  [phone]  Physical Therapy Treatment  Patient Details  Name: Theresa Munoz MRN: [id number] Date of Birth: 1955-11-30 Referring Provider: Dr. Rodell Perna  Encounter Date: 06/03/2015      PT End of Session - 06/03/15 1647    Visit Number 6   Number of Visits 10   Date for PT Re-Evaluation 07/08/15   PT Start Time 1619   PT Stop Time 1700   PT Time Calculation (min) 41 min   Activity Tolerance Patient tolerated treatment well   Behavior During Therapy [facility] for tasks assessed/performed      Past Medical History  Diagnosis Date  . Allergy   . Thyroid disease   . Asthma   . Anemia   . Depression   . Fibromyalgia     History reviewed. No pertinent past surgical history.  There were no vitals filed for this visit.  Visit Diagnosis:  Cervical pain (neck)  Cervical disc disorder with radiculopathy of cervical region      Subjective Assessment - 06/03/15 1644    Subjective Continues to feel good. She demonstrates being able to turn her head LT without pain and maybe 25% limiitation.    Currently in Pain? Yes   Pain Score 2    Pain Location Neck   Pain Orientation Left   Pain Descriptors / Indicators Dull   Aggravating Factors  Driving   Pain Relieving Factors laying flat, traction   Multiple Pain Sites No                         [facility] Adult PT Treatment/Exercise - 06/03/15 0001    Traction   Min (lbs) 5   Max (lbs) 13   Time 15   Manual Therapy   Soft tissue mobilization bil. cervical paraspinals, upper trapezius, and suboccipital                  PT Short Term Goals - 06/03/15 1650    PT SHORT TERM GOAL #2   Title sit for 30 min in a car to travel with moderate pain   Time 3   Period Weeks   Status Achieved  Mild pain           PT Long Term Goals - 05/28/15 1453    PT LONG  TERM GOAL #1   Title independent with HEP    Time 6   Period Weeks   Status On-going  still learning exercises   PT LONG TERM GOAL #2   Title sit in a car to travel for 1 hour with moderate pain   Time 6   Status On-going   PT LONG TERM GOAL #3   Title understand ways to manage her cervical pain with correct posture   Time 6   Period Weeks   Status Achieved   PT LONG TERM GOAL #4   Title return to regulary mild exercise for 3 days per week for 20 min   Time 6   Period Weeks   Status On-going   PT LONG TERM GOAL #5   Title pain decreased to moderate consistently withdaily activities   Time 6   Period Weeks   Status On-going               Plan - 06/03/15 1647    Clinical Impression Statement Pt continues to  make improvements. Tingling continues to abolish, and pain is lower since her trip. She demonstrated turning her head LT with maybe 25% limitation today.  All STG met today.   Pt will benefit from skilled therapeutic intervention in order to improve on the following deficits Decreased range of motion;Increased fascial restricitons;Pain;Increased muscle spasms;Decreased endurance;Decreased activity tolerance;Impaired flexibility;Decreased mobility;Decreased strength   Rehab Potential Good   Clinical Impairments Affecting Rehab Potential None   PT Frequency 2x / week   PT Duration 6 weeks   PT Treatment/Interventions Other (comment);ADLs/Self Care Home Management;Cryotherapy;Electrical Stimulation;Moist Heat;Therapeutic exercise;Therapeutic activities;Ultrasound;Neuromuscular re-education;Patient/family education;Manual techniques;Passive range of motion;Traction   PT Next Visit Plan  cervical traction, soft tissue work, check HEP   Consulted and Agree with Plan of Care Patient        Problem List Patient Active Problem List   Diagnosis Date Noted  . Atypical chest pain 02/19/2014  . Family history of ischemic heart disease 02/19/2014  . Pure hypercholesterolemia  02/19/2014  . Palpitations 02/19/2014  . Obesity, unspecified 02/19/2014    Myrene Galas , PTA [facility] [address] [place], [place], [address] Phone: [phone]   Fax:  [phone]  Name: Theresa Munoz MRN: [id number] Date of Birth: 1955/07/27

## 2015-06-09 ENCOUNTER — Encounter: Payer: Medicare Other | Admitting: Physical Therapy

## 2015-06-10 ENCOUNTER — Encounter: Payer: Self-pay | Admitting: Cardiology

## 2015-06-11 ENCOUNTER — Ambulatory Visit: Payer: Medicare Other | Admitting: Physical Therapy

## 2015-06-11 DIAGNOSIS — M542 Cervicalgia: Secondary | ICD-10-CM | POA: Diagnosis not present

## 2015-06-11 DIAGNOSIS — M5010 Cervical disc disorder with radiculopathy, unspecified cervical region: Secondary | ICD-10-CM

## 2015-06-11 NOTE — Therapy (Signed)
[facility] [address] [place], [place], [address] Phone: [phone]   Fax:  [phone]  Physical Therapy Treatment  Patient Details  Name: Theresa Munoz MRN: [id number] Date of Birth: 1955/12/01 Referring Provider: Dr. Annell Greening  Encounter Date: 06/11/2015      PT End of Session - 06/11/15 1430    Visit Number 7   Number of Visits 10  medicare   Date for PT Re-Evaluation 07/08/15   PT Start Time 1410  Patient came 10 min. late   PT Stop Time 1445   PT Time Calculation (min) 35 min   Activity Tolerance Other (comment);Patient tolerated treatment well  patient had dental surgery so not up to exercise or alot of cervical movement   Behavior During Therapy [facility] for tasks assessed/performed      Past Medical History  Diagnosis Date  . Allergy   . Thyroid disease   . Asthma   . Anemia   . Depression   . Fibromyalgia     No past surgical history on file.  There were no vitals filed for this visit.  Visit Diagnosis:  Cervical pain (neck)  Cervical disc disorder with radiculopathy of cervical region      Subjective Assessment - 06/11/15 1410    Subjective Patient came 10 min late. I have a little pain today. I had to have unexpected dental surgery and not to bump my head around on Monday. Pain does not go into the left arm anymore.    Limitations Sitting   How long can you sit comfortably? 30 min.    Diagnostic tests x-ray showed spondylosis with osteophyte   Patient Stated Goals I have trouble tucking my chin down.    Currently in Pain? Yes   Pain Score 4    Pain Location Neck   Pain Orientation Left   Pain Descriptors / Indicators Dull   Pain Type Chronic pain   Pain Onset More than a month ago   Pain Frequency Intermittent   Aggravating Factors  driving   Pain Relieving Factors laying flat, traction                         [facility] Adult PT Treatment/Exercise - 06/11/15  0001    Traction   Type of Traction Cervical   Min (lbs) 5   Max (lbs) 13   Time 15   Manual Therapy   Manual Therapy Soft tissue mobilization   Soft tissue mobilization bil. cervical paraspinals, upper trapezius, scalenes and suboccipital                PT Education - 06/11/15 1430    Education provided No          PT Short Term Goals - 06/03/15 1650    PT SHORT TERM GOAL #2   Title sit for 30 min in a car to travel with moderate pain   Time 3   Period Weeks   Status Achieved  Mild pain           PT Long Term Goals - 06/11/15 1434    PT LONG TERM GOAL #1   Title independent with HEP    Time 6   Period Weeks   Status On-going  still learning   PT LONG TERM GOAL #2   Title sit in a car to travel for 1 hour with moderate pain   Time 6   Period Weeks  Status On-going   PT LONG TERM GOAL #3   Title understand ways to manage her cervical pain with correct posture   Time 6   Period Weeks   Status Achieved   PT LONG TERM GOAL #4   Time 6   Period Weeks   Status On-going  just had dental surgery   PT LONG TERM GOAL #5   Title pain decreased to moderate consistently withdaily activities   Time 6   Period Weeks   Status On-going  just had dental surgery               Plan - 06/11/15 1435    Clinical Impression Statement Patient just had dental surgery on 06/01/2015 so she does not want to exercise or move her head alot for today.  Patient has increased pain to 4/10 since she had dental surgery.  Patient has not pain in left upper arm anymore. Patient can rsume exercise next visit.  Patient will benefit from physical therapy to reduce pain and improve mobility.    Pt will benefit from skilled therapeutic intervention in order to improve on the following deficits Decreased range of motion;Increased fascial restricitons;Pain;Increased muscle spasms;Decreased endurance;Decreased activity tolerance;Impaired flexibility;Decreased mobility;Decreased  strength   Rehab Potential Good   Clinical Impairments Affecting Rehab Potential None   PT Frequency 2x / week   PT Duration 6 weeks   PT Treatment/Interventions Other (comment);ADLs/Self Care Home Management;Cryotherapy;Electrical Stimulation;Moist Heat;Therapeutic exercise;Therapeutic activities;Ultrasound;Neuromuscular re-education;Patient/family education;Manual techniques;Passive range of motion;Traction   PT Next Visit Plan  cervical traction, soft tissue work, check HEP; resume exercise   PT Home Exercise Plan progress as needed   Recommended Other Services None   Consulted and Agree with Plan of Care Patient        Problem List Patient Active Problem List   Diagnosis Date Noted  . Atypical chest pain 02/19/2014  . Family history of ischemic heart disease 02/19/2014  . Pure hypercholesterolemia 02/19/2014  . Palpitations 02/19/2014  . Obesity, unspecified 02/19/2014    Eulis Foster, PT 06/11/2015 2:39 PM   [facility] [address], [address] [place], [place], [address] Phone: [phone]   Fax:  [phone]  Name: Theresa Munoz MRN: [id number] Date of Birth: July 31, 1955

## 2015-06-16 ENCOUNTER — Ambulatory Visit: Payer: Medicare Other | Admitting: Physical Therapy

## 2015-06-16 ENCOUNTER — Encounter: Payer: Self-pay | Admitting: Physical Therapy

## 2015-06-16 DIAGNOSIS — M542 Cervicalgia: Secondary | ICD-10-CM

## 2015-06-16 DIAGNOSIS — M5010 Cervical disc disorder with radiculopathy, unspecified cervical region: Secondary | ICD-10-CM

## 2015-06-16 NOTE — Therapy (Signed)
[facility] [address] [place], [place], [address] Phone: [phone]   Fax:  [phone]  Physical Therapy Treatment  Patient Details  Name: Theresa Munoz MRN: [id number] Date of Birth: September 16, 1955 Referring Provider: Dr. Annell Greening  Encounter Date: 06/16/2015      PT End of Session - 06/16/15 1428    Visit Number 8   Number of Visits 10  Medicare   Date for PT Re-Evaluation 07/08/15   PT Start Time 1410   PT Stop Time 1446   PT Time Calculation (min) 36 min   Activity Tolerance Other (comment);Patient tolerated treatment well  10 min. late   Behavior During Therapy W[facility] for tasks assessed/performed      Past Medical History  Diagnosis Date  . Allergy   . Thyroid disease   . Asthma   . Anemia   . Depression   . Fibromyalgia     History reviewed. No pertinent past surgical history.  There were no vitals filed for this visit.  Visit Diagnosis:  Cervical pain (neck)  Cervical disc disorder with radiculopathy of cervical region      Subjective Assessment - 06/16/15 1411    Subjective Patient came 10 min. late. Neck feels tight. Stress has increased the tightenss. Patient reports pain is 75% decreased . The dentist does not want me to do exercises with my neck yet.    Limitations Sitting   How long can you sit comfortably? 30 min.    Diagnostic tests x-ray showed spondylosis with osteophyte   Patient Stated Goals I have trouble tucking my chin down.    Currently in Pain? Yes   Pain Score 4    Pain Location Neck   Pain Orientation Left   Pain Descriptors / Indicators Dull   Pain Type Chronic pain   Pain Onset More than a month ago   Pain Frequency Intermittent   Aggravating Factors  driving   Pain Relieving Factors laying flat, traction   Multiple Pain Sites No                         [facility] Adult PT Treatment/Exercise - 06/16/15 0001    Traction   Type of Traction  Cervical   Min (lbs) 5   Max (lbs) 14   Time 15   Manual Therapy   Manual Therapy Soft tissue mobilization   Soft tissue mobilization bil. cervical paraspinals, upper trapezius, scalenes and suboccipital                PT Education - 06/16/15 1427    Education provided Yes   Education Details information for Home TENS unit   Person(s) Educated Patient   Methods Explanation;Handout   Comprehension Verbalized understanding          PT Short Term Goals - 06/03/15 1650    PT SHORT TERM GOAL #2   Title sit for 30 min in a car to travel with moderate pain   Time 3   Period Weeks   Status Achieved  Mild pain           PT Long Term Goals - 06/16/15 1414    PT LONG TERM GOAL #1   Title independent with HEP    Time 6   Period Weeks   Status On-going   PT LONG TERM GOAL #2   Title sit in a car to travel for 1 hour with moderate pain  Time 6   Period Weeks   Status On-going   PT LONG TERM GOAL #3   Title understand ways to manage her cervical pain with correct posture   Time 6   Period Weeks   Status Achieved   PT LONG TERM GOAL #4   Title return to regulary mild exercise for 3 days per week for 20 min   Time 6   Period Weeks   Status On-going   PT LONG TERM GOAL #5   Title pain decreased to moderate consistently withdaily activities   Time 6   Period Weeks   Status On-going               Plan - 06/16/15 1429    Clinical Impression Statement Patient reports her pain is 75% decreased in pain since initial evaluation.  Patient feels a home TENS unit and cervical traction will help at home.  Patient pain is staying 4/10. Patient not able to exercise due to restriction from dental surgery. Patient has tenderness located in right side of T1-3. Patient will benefit from physical therapy to reduce pain and omprove mobility.    Pt will benefit from skilled therapeutic intervention in order to improve on the following deficits Decreased range of  motion;Increased fascial restricitons;Pain;Increased muscle spasms;Decreased endurance;Decreased activity tolerance;Impaired flexibility;Decreased mobility;Decreased strength   Rehab Potential Good   Clinical Impairments Affecting Rehab Potential None   PT Frequency 2x / week   PT Duration 6 weeks   PT Treatment/Interventions Other (comment);ADLs/Self Care Home Management;Cryotherapy;Electrical Stimulation;Moist Heat;Therapeutic exercise;Therapeutic activities;Ultrasound;Neuromuscular re-education;Patient/family education;Manual techniques;Passive range of motion;Traction  home TENS unit   PT Next Visit Plan  cervical traction, soft tissue work,    PT Home Exercise Plan progress as needed   Consulted and Agree with Plan of Care Patient        Problem List Patient Active Problem List   Diagnosis Date Noted  . Atypical chest pain 02/19/2014  . Family history of ischemic heart disease 02/19/2014  . Pure hypercholesterolemia 02/19/2014  . Palpitations 02/19/2014  . Obesity, unspecified 02/19/2014    Eulis Foster, PT 06/16/2015 2:32 PM   [facility] [address], [address] [place], [place], [address] Phone: [phone]   Fax:  [phone]  Name: Theresa Munoz MRN: [id number] Date of Birth: [birth year]

## 2015-06-18 ENCOUNTER — Encounter: Payer: Self-pay | Admitting: Physical Therapy

## 2015-06-18 ENCOUNTER — Ambulatory Visit: Payer: Medicare Other | Admitting: Physical Therapy

## 2015-06-18 DIAGNOSIS — M542 Cervicalgia: Secondary | ICD-10-CM | POA: Diagnosis not present

## 2015-06-18 DIAGNOSIS — M5010 Cervical disc disorder with radiculopathy, unspecified cervical region: Secondary | ICD-10-CM

## 2015-06-18 NOTE — Therapy (Signed)
[id number] [address] [place], [place], [address] Phone: [phone]   Fax:  [phone]  Physical Therapy Treatment  Patient Details  Name: Theresa Munoz MRN: [id number] Date of Birth: 1955-10-21 Referring Provider: Dr. Annell Greening  Encounter Date: 06/18/2015      PT End of Session - 06/18/15 1415    Visit Number 9   Number of Visits 10  Medicare   Date for PT Re-Evaluation 07/08/15   PT Start Time 1406   PT Stop Time 1458   PT Time Calculation (min) 52 min   Activity Tolerance Other (comment);Patient tolerated treatment well   Behavior During Therapy [facility] for tasks assessed/performed      Past Medical History  Diagnosis Date  . Allergy   . Thyroid disease   . Asthma   . Anemia   . Depression   . Fibromyalgia     History reviewed. No pertinent past surgical history.  There were no vitals filed for this visit.  Visit Diagnosis:  Cervical pain (neck)  Cervical disc disorder with radiculopathy of cervical region      Subjective Assessment - 06/18/15 1408    Subjective I think I can do exercises today. Pain is best in years.    Limitations Sitting   How long can you sit comfortably? 30 min.    Diagnostic tests x-ray showed spondylosis with osteophyte   Patient Stated Goals I have trouble tucking my chin down.    Currently in Pain? Yes   Pain Score 2    Pain Location Neck   Pain Orientation Left   Pain Descriptors / Indicators Dull   Pain Type Chronic pain   Pain Onset More than a month ago   Pain Frequency Constant   Aggravating Factors  driving   Pain Relieving Factors laying flat, traction    Multiple Pain Sites No                         [facility] Adult PT Treatment/Exercise - 06/18/15 0001    Neck Exercises: Supine   Neck Retraction 15 reps;5 secs  head on red physioball   Cervical Rotation 10 reps;Both  with head on red physioball   Shoulder Flexion Left;Right;20  reps  with head retraction on red physioball   Traction   Type of Traction Cervical   Min (lbs) 5   Max (lbs) 14   Time 15   Manual Therapy   Manual Therapy Soft tissue mobilization   Soft tissue mobilization bil. cervical paraspinals, upper trapezius, scalenes and suboccipital                PT Education - 06/18/15 1439    Education provided Yes   Education Details information on home traction unit   Person(s) Educated Patient   Methods Explanation   Comprehension Verbalized understanding          PT Short Term Goals - 06/03/15 1650    PT SHORT TERM GOAL #2   Title sit for 30 min in a car to travel with moderate pain   Time 3   Period Weeks   Status Achieved  Mild pain           PT Long Term Goals - 06/16/15 1414    PT LONG TERM GOAL #1   Title independent with HEP    Time 6   Period Weeks   Status On-going   PT LONG TERM GOAL #  2   Title sit in a car to travel for 1 hour with moderate pain   Time 6   Period Weeks   Status On-going   PT LONG TERM GOAL #3   Title understand ways to manage her cervical pain with correct posture   Time 6   Period Weeks   Status Achieved   PT LONG TERM GOAL #4   Title return to regulary mild exercise for 3 days per week for 20 min   Time 6   Period Weeks   Status On-going   PT LONG TERM GOAL #5   Title pain decreased to moderate consistently withdaily activities   Time 6   Period Weeks   Status On-going               Plan - 06/18/15 1439    Clinical Impression Statement Patient pain is 2/10 and felt the best it has ever felt.  Patient is still looking into a home traction unit.  Patient was able to tolerate exercises for first time since she had dental work done.  Patient will benefit from skilled PT to reduce pain and decrease mobilty.    Pt will benefit from skilled therapeutic intervention in order to improve on the following deficits Decreased range of motion;Increased fascial  restricitons;Pain;Increased muscle spasms;Decreased endurance;Decreased activity tolerance;Impaired flexibility;Decreased mobility;Decreased strength   Rehab Potential Good   Clinical Impairments Affecting Rehab Potential None   PT Frequency 2x / week   PT Duration 6 weeks   PT Treatment/Interventions Other (comment);ADLs/Self Care Home Management;Cryotherapy;Electrical Stimulation;Moist Heat;Therapeutic exercise;Therapeutic activities;Ultrasound;Neuromuscular re-education;Patient/family education;Manual techniques;Passive range of motion;Traction  Home TENS unit   PT Next Visit Plan  cervical traction, soft tissue work, cervical stabilization exercises; g-code and progress note   PT Home Exercise Plan progress as needed   Consulted and Agree with Plan of Care Patient        Problem List Patient Active Problem List   Diagnosis Date Noted  . Atypical chest pain 02/19/2014  . Family history of ischemic heart disease 02/19/2014  . Pure hypercholesterolemia 02/19/2014  . Palpitations 02/19/2014  . Obesity, unspecified 02/19/2014    Eulis Foster, PT 06/18/2015 2:43 PM   [place] [address] [place], [place], [address] Phone: [phone]   Fax:  [phone]  Name: Theresa Munoz MRN: [id number] Date of Birth: 27-Jan-1956

## 2015-06-22 ENCOUNTER — Ambulatory Visit: Payer: Medicare Other | Admitting: Physical Therapy

## 2015-06-25 ENCOUNTER — Ambulatory Visit: Payer: Medicare Other | Attending: Orthopaedic Surgery | Admitting: Physical Therapy

## 2015-06-25 DIAGNOSIS — M5010 Cervical disc disorder with radiculopathy, unspecified cervical region: Secondary | ICD-10-CM | POA: Insufficient documentation

## 2015-06-25 DIAGNOSIS — M542 Cervicalgia: Secondary | ICD-10-CM | POA: Insufficient documentation

## 2015-06-30 ENCOUNTER — Ambulatory Visit: Payer: Medicare Other | Admitting: Physical Therapy

## 2015-06-30 ENCOUNTER — Encounter: Payer: Self-pay | Admitting: Physical Therapy

## 2015-06-30 DIAGNOSIS — M5010 Cervical disc disorder with radiculopathy, unspecified cervical region: Secondary | ICD-10-CM | POA: Diagnosis present

## 2015-06-30 DIAGNOSIS — M542 Cervicalgia: Secondary | ICD-10-CM

## 2015-06-30 NOTE — Therapy (Signed)
[facility] [address] [place], [place], [address] Phone: [phone]   Fax:  [phone]  Physical Therapy Treatment  Patient Details  Name: Theresa Munoz MRN: [id number] Date of Birth: 1956/04/25 Referring Provider: Dr. Annell Greening  Encounter Date: 06/30/2015      PT End of Session - 06/30/15 1538    Visit Number 10   Number of Visits 20  Medicare   Date for PT Re-Evaluation 07/08/15   PT Start Time 1530   PT Stop Time 1618   PT Time Calculation (min) 48 min   Activity Tolerance Patient tolerated treatment well   Behavior During Therapy [facility] for tasks assessed/performed      Past Medical History  Diagnosis Date  . Allergy   . Thyroid disease   . Asthma   . Anemia   . Depression   . Fibromyalgia     History reviewed. No pertinent past surgical history.  There were no vitals filed for this visit.  Visit Diagnosis:  Cervical pain (neck)  Cervical disc disorder with radiculopathy of cervical region      Subjective Assessment - 06/30/15 1608    Subjective I am cleared for exercise from my dentist. Moderate level pain in cervical with daily activities.    Limitations Sitting   Patient Stated Goals I have trouble tucking my chin down.    Currently in Pain? Yes   Pain Score 3    Pain Location Neck   Pain Descriptors / Indicators Dull   Pain Type Chronic pain   Pain Onset More than a month ago   Pain Frequency Intermittent   Aggravating Factors  driving   Pain Relieving Factors laying flat, traction   Multiple Pain Sites No            [facility] PT Assessment - 06/30/15 0001    Assessment   Medical Diagnosis Cervical spondylosis, neck pain, left UE radiculopathy   Referring Provider Dr. Annell Greening   Onset Date/Surgical Date 01/22/15   Hand Dominance Right   Prior Therapy yes-traction  over the door traction unit increased her TMJ pain   Prior Function   Level of Independence Independent  with basic ADLs   Cognition   Overall Cognitive Status Within Functional Limits for tasks assessed   Observation/Other Assessments   Focus on Therapeutic Outcomes (FOTO)  50% limitation   ROM / Strength   AROM / PROM / Strength AROM   AROM   AROM Assessment Site Cervical   Cervical Flexion full   Cervical Extension decreased by 50%   Cervical - Right Side Bend decreased by 25%   Cervical - Left Side Bend decreased by 25%   Cervical - Right Rotation decreased by 10%   Cervical - Left Rotation decreased by 10%                     [facility] Adult PT Treatment/Exercise - 06/30/15 0001    Neck Exercises: Supine   Neck Retraction 15 reps;5 secs  head on red physioball   Cervical Rotation 10 reps;Both  with head on red physioball   Shoulder Flexion Left;Right;20 reps  with head retraction on red physioball   Shoulder Flexion Limitations head press into red ball   Shoulder ABduction 15 reps;Both;Other (comment)  horizontal   Shoulder Abduction Limitations yellow band  head press into red ball   Upper Extremity D1 Extension;10 reps;Theraband;Other (comment)   Theraband Level (UE D1) Level 1 (Yellow)  yellow band   Upper Extremity D2 Extension;10 reps;Theraband;Other (comment)  yellow band   Theraband Level (UE D2) Level 1 (Yellow)   Traction   Type of Traction Cervical   Min (lbs) 5   Max (lbs) 14   Time 15   Manual Therapy   Manual Therapy Soft tissue mobilization   Soft tissue mobilization bil. cervical paraspinals, upper trapezius, scalenes and suboccipital                PT Education - 07-22-15 1604    Education provided No          PT Short Term Goals - 06/03/15 1650    PT SHORT TERM GOAL #2   Title sit for 30 min in a car to travel with moderate pain   Time 3   Period Weeks   Status Achieved  Mild pain           PT Long Term Goals - 07/22/15 1613    PT LONG TERM GOAL #1   Title independent with HEP    Time 6   Period Weeks   Status  On-going  still learning   PT LONG TERM GOAL #2   Title sit in a car to travel for 1 hour with moderate pain   Time 6   Period Weeks   Status On-going   PT LONG TERM GOAL #3   Title understand ways to manage her cervical pain with correct posture   Time 6   Period Weeks   Status Achieved   PT LONG TERM GOAL #4   Title return to regulary mild exercise for 3 days per week for 20 min   Time 6   Period Weeks   Status On-going  hs not been exercising due to helping her mom   PT LONG TERM GOAL #5   Title pain decreased to moderate consistently withdaily activities   Time 6   Period Weeks   Status On-going  Moderate               Plan - Jul 22, 2015 1604    Clinical Impression Statement Patient FOTO improved to 505 limitation compared to 53% limitation. Patient reports pain is moderate with daily activities. Patient does no have pain in left deltoid. Patient has not change in cervical ROM.  Patient is able to perform higher level cervical stabilization exercises and use theraband  without increase in pain. Patient is stil learning exercise. Patient is not on an exercise program due to wisdom teeth taken out and was limitated. Patient will benefit from skilled therapy to increase cervical ROM and stabilization.    Pt will benefit from skilled therapeutic intervention in order to improve on the following deficits Decreased range of motion;Increased fascial restricitons;Pain;Increased muscle spasms;Decreased endurance;Decreased activity tolerance;Impaired flexibility;Decreased mobility;Decreased strength   Rehab Potential Good   Clinical Impairments Affecting Rehab Potential None   PT Frequency 2x / week   PT Duration 6 weeks   PT Treatment/Interventions Other (comment);ADLs/Self Care Home Management;Cryotherapy;Electrical Stimulation;Moist Heat;Therapeutic exercise;Therapeutic activities;Ultrasound;Neuromuscular re-education;Patient/family education;Manual techniques;Passive range of  motion;Traction  Home TENS unit   PT Next Visit Plan  cervical traction, soft tissue work, cervical stabilization exercises   PT Home Exercise Plan progress as needed   Consulted and Agree with Plan of Care Patient          G-Codes - 2015-07-22 1538    Functional Assessment Tool Used FOTO score is 50% limitation   Functional Limitation Changing and maintaining body position  Changing and Maintaining Body Position Current Status [phone]) At least 40 percent but less than 60 percent impaired, limited or restricted   Changing and Maintaining Body Position Goal Status (U0454) At least 40 percent but less than 60 percent impaired, limited or restricted      Problem List Patient Active Problem List   Diagnosis Date Noted  . Atypical chest pain 02/19/2014  . Family history of ischemic heart disease 02/19/2014  . Pure hypercholesterolemia 02/19/2014  . Palpitations 02/19/2014  . Obesity, unspecified 02/19/2014    Eulis Foster, PT 06/30/2015 4:15 PM    [facility] [address], [address] [place], [place], [address] Phone: [phone]   Fax:  [phone]  Name: LUAN URBANI MRN: [id number] Date of Birth: March 15, 1956

## 2015-07-02 ENCOUNTER — Encounter: Payer: Self-pay | Admitting: Physical Therapy

## 2015-07-02 ENCOUNTER — Ambulatory Visit: Payer: Medicare Other | Admitting: Physical Therapy

## 2015-07-02 DIAGNOSIS — M5010 Cervical disc disorder with radiculopathy, unspecified cervical region: Secondary | ICD-10-CM

## 2015-07-02 DIAGNOSIS — M542 Cervicalgia: Secondary | ICD-10-CM | POA: Diagnosis not present

## 2015-07-02 NOTE — Therapy (Signed)
[facility] [address] [place], [place], [address] Phone: [phone]   Fax:  [phone]  Physical Therapy Treatment  Patient Details  Name: Theresa Munoz MRN: [id number] Date of Birth: 28-Oct-1955 Referring Provider: Dr. Annell Greening  Encounter Date: 07/02/2015      PT End of Session - 07/02/15 1621    Visit Number 11   Number of Visits 20  medicare   Date for PT Re-Evaluation 07/08/15   PT Start Time 1615   PT Stop Time 1700   PT Time Calculation (min) 45 min   Activity Tolerance Patient tolerated treatment well   Behavior During Therapy [facility] for tasks assessed/performed      Past Medical History  Diagnosis Date  . Allergy   . Thyroid disease   . Asthma   . Anemia   . Depression   . Fibromyalgia     History reviewed. No pertinent past surgical history.  There were no vitals filed for this visit.  Visit Diagnosis:  Cervical pain (neck)  Cervical disc disorder with radiculopathy of cervical region      Subjective Assessment - 07/02/15 1622    Subjective I am doing well.  No incresae pain after last visit. Driving is 69% better.    Limitations Sitting   How long can you sit comfortably? 30 min.    Diagnostic tests x-ray showed spondylosis with osteophyte   Patient Stated Goals I have trouble tucking my chin down.    Currently in Pain? Yes   Pain Score 2    Pain Location Neck   Pain Orientation Left   Pain Descriptors / Indicators Dull   Pain Type Chronic pain   Pain Onset More than a month ago   Pain Frequency Intermittent   Aggravating Factors  driving   Pain Relieving Factors traction, laying flat   Multiple Pain Sites No                         [facility] Adult PT Treatment/Exercise - 07/02/15 0001    Neck Exercises: Supine   Neck Retraction 15 reps;5 secs  head on red physioball   Cervical Rotation 10 reps;Both  with head on red physioball   Shoulder Flexion  Left;Right;20 reps;Weights  with head retraction on red physioball   Shoulder Flexion Weights (lbs) 1   Shoulder ABduction 15 reps;Both;Other (comment);Weights  horizontal   Shoulder Abduction Weights (lbs) 1   Upper Extremity D1 Extension;10 reps;Other (comment);Weights   UE D1 Weights (lbs) 1   Upper Extremity D2 Extension;10 reps;Other (comment);Weights  yellow band   UE D2 Weights (lbs) 1   Traction   Type of Traction Cervical   Min (lbs) 5   Max (lbs) 16   Time 15   Manual Therapy   Manual Therapy Soft tissue mobilization   Soft tissue mobilization bil. cervical paraspinals, upper trapezius, scalenes and suboccipital                PT Education - 07/02/15 1627    Education provided Yes   Education Details home traction unit at 16 pounds and 155 grade   Person(s) Educated Patient   Methods Explanation   Comprehension Verbalized understanding          PT Short Term Goals - 06/03/15 1650    PT SHORT TERM GOAL #2   Title sit for 30 min in a car to travel with moderate pain   Time 3  Period Weeks   Status Achieved  Mild pain           PT Long Term Goals - 06/30/15 1613    PT LONG TERM GOAL #1   Title independent with HEP    Time 6   Period Weeks   Status On-going  still learning   PT LONG TERM GOAL #2   Title sit in a car to travel for 1 hour with moderate pain   Time 6   Period Weeks   Status On-going   PT LONG TERM GOAL #3   Title understand ways to manage her cervical pain with correct posture   Time 6   Period Weeks   Status Achieved   PT LONG TERM GOAL #4   Title return to regulary mild exercise for 3 days per week for 20 min   Time 6   Period Weeks   Status On-going  hs not been exercising due to helping her mom   PT LONG TERM GOAL #5   Title pain decreased to moderate consistently withdaily activities   Time 6   Period Weeks   Status On-going  Moderate               Plan - 07/02/15 1627    Clinical Impression  Statement Patient is now using weights with cervical stabilization exercises with no increase in pain.  Pateint reports her neck has never felt as good as it does.  Patient will benefit from physical therapy to  improve cervical stabilzation.    Pt will benefit from skilled therapeutic intervention in order to improve on the following deficits Decreased range of motion;Increased fascial restricitons;Pain;Increased muscle spasms;Decreased endurance;Decreased activity tolerance;Impaired flexibility;Decreased mobility;Decreased strength   Rehab Potential Good   Clinical Impairments Affecting Rehab Potential None   PT Frequency 2x / week   PT Duration 6 weeks   PT Treatment/Interventions Other (comment);ADLs/Self Care Home Management;Cryotherapy;Electrical Stimulation;Moist Heat;Therapeutic exercise;Therapeutic activities;Ultrasound;Neuromuscular re-education;Patient/family education;Manual techniques;Passive range of motion;Traction  home TENS unit   PT Next Visit Plan  cervical traction, soft tissue work, cervical stabilization exercises   PT Home Exercise Plan progress as needed   Consulted and Agree with Plan of Care Patient        Problem List Patient Active Problem List   Diagnosis Date Noted  . Atypical chest pain 02/19/2014  . Family history of ischemic heart disease 02/19/2014  . Pure hypercholesterolemia 02/19/2014  . Palpitations 02/19/2014  . Obesity, unspecified 02/19/2014    Eulis Foster, PT 07/02/2015 4:51 PM   [facility] [address], [address] [place], [place], [address] Phone: [phone]   Fax:  [phone]  Name: Theresa Munoz MRN: [id number] Date of Birth: [birth year]

## 2015-07-06 ENCOUNTER — Ambulatory Visit: Payer: Medicare Other | Admitting: Physical Therapy

## 2015-07-06 ENCOUNTER — Encounter: Payer: Self-pay | Admitting: Physical Therapy

## 2015-07-06 DIAGNOSIS — M542 Cervicalgia: Secondary | ICD-10-CM | POA: Diagnosis not present

## 2015-07-06 DIAGNOSIS — M5010 Cervical disc disorder with radiculopathy, unspecified cervical region: Secondary | ICD-10-CM

## 2015-07-06 NOTE — Therapy (Signed)
[facility] [address], [address] [place], [place], [address] Phone: [phone]   Fax:  [phone]  Physical Therapy Treatment  Patient Details  Name: KATLYN MULDREW MRN: [id number] Date of Birth: 02-05-1956 Referring Provider: Dr. Annell Greening  Encounter Date: 07/06/2015      PT End of Session - 07/06/15 1407    Visit Number 12   Number of Visits 20   Date for PT Re-Evaluation 07/08/15   PT Start Time 1405   PT Stop Time 1455   PT Time Calculation (min) 50 min   Activity Tolerance Patient tolerated treatment well   Behavior During Therapy [facility] for tasks assessed/performed      Past Medical History  Diagnosis Date  . Allergy   . Thyroid disease   . Asthma   . Anemia   . Depression   . Fibromyalgia     History reviewed. No pertinent past surgical history.  There were no vitals filed for this visit.  Visit Diagnosis:  Cervical pain (neck)  Cervical disc disorder with radiculopathy of cervical region      Subjective Assessment - 07/06/15 1407    Subjective Had a long drive with no medication.  This made her hurt but not as bad as it was.    Currently in Pain? No/denies   Multiple Pain Sites No                         [facility] Adult PT Treatment/Exercise - 07/06/15 0001    Neck Exercises: Supine   Neck Retraction 15 reps;5 secs  head on red physioball   Cervical Rotation 10 reps;Both  with head on red physioball   Shoulder Flexion Left;Right;20 reps;Weights  with head retraction on red physioball   Shoulder Flexion Weights (lbs) --  No wts per pt request to see if she doesn't hurt.   Shoulder ABduction 15 reps;Both;Other (comment);Weights  horizontal   Shoulder Abduction Weights (lbs) --  yellow band   Upper Extremity D2 Extension;10 reps;Other (comment);Weights  yellow band   Theraband Level (UE D2) Level 1 (Yellow)   Traction   Type of Traction Cervical   Min (lbs) 5   Max (lbs)  16   Time 15   Manual Therapy   Manual Therapy Soft tissue mobilization   Soft tissue mobilization bil. cervical paraspinals, upper trapezius, scalenes and suboccipital                  PT Short Term Goals - 06/03/15 1650    PT SHORT TERM GOAL #2   Title sit for 30 min in a car to travel with moderate pain   Time 3   Period Weeks   Status Achieved  Mild pain           PT Long Term Goals - 06/30/15 1613    PT LONG TERM GOAL #1   Title independent with HEP    Time 6   Period Weeks   Status On-going  still learning   PT LONG TERM GOAL #2   Title sit in a car to travel for 1 hour with moderate pain   Time 6   Period Weeks   Status On-going   PT LONG TERM GOAL #3   Title understand ways to manage her cervical pain with correct posture   Time 6   Period Weeks   Status Achieved   PT LONG TERM GOAL #4   Title return  to regulary mild exercise for 3 days per week for 20 min   Time 6   Period Weeks   Status On-going  hs not been exercising due to helping her mom   PT LONG TERM GOAL #5   Title pain decreased to moderate consistently withdaily activities   Time 6   Period Weeks   Status On-going  Moderate               Plan - 07/06/15 1411    Clinical Impression Statement pt reports  being sore x2 days after "using the weights." She thinks the yellow band might have been better. We dropped the 1# wts today and replaced them with the band. Pt will report on her next visit how she felt dropping the wts. She does present today pain free.    Pt will benefit from skilled therapeutic intervention in order to improve on the following deficits Decreased range of motion;Increased fascial restricitons;Pain;Increased muscle spasms;Decreased endurance;Decreased activity tolerance;Impaired flexibility;Decreased mobility;Decreased strength   Rehab Potential Good   Clinical Impairments Affecting Rehab Potential None   PT Frequency 2x / week   PT Duration 6 weeks   PT  Treatment/Interventions Other (comment);ADLs/Self Care Home Management;Cryotherapy;Electrical Stimulation;Moist Heat;Therapeutic exercise;Therapeutic activities;Ultrasound;Neuromuscular re-education;Patient/family education;Manual techniques;Passive range of motion;Traction   PT Next Visit Plan Probable DC next visit per pt. Decide if pt wants to use wts,bands, or nothing for HEP. Discussed water yoga today....follow up with this idea.   Consulted and Agree with Plan of Care Patient        Problem List Patient Active Problem List   Diagnosis Date Noted  . Atypical chest pain 02/19/2014  . Family history of ischemic heart disease 02/19/2014  . Pure hypercholesterolemia 02/19/2014  . Palpitations 02/19/2014  . Obesity, unspecified 02/19/2014    Leo Fray, PTA 07/06/2015, 2:41 PM  [facility] [address], [address] [place], [place], [address] Phone: [phone]   Fax:  [phone]  Name: BRILYNN BIASI MRN: [id number] Date of Birth: December 16, 1955

## 2015-07-08 ENCOUNTER — Encounter: Payer: Self-pay | Admitting: Physical Therapy

## 2015-07-08 ENCOUNTER — Ambulatory Visit: Payer: Medicare Other | Admitting: Physical Therapy

## 2015-07-08 DIAGNOSIS — M542 Cervicalgia: Secondary | ICD-10-CM

## 2015-07-08 DIAGNOSIS — M5010 Cervical disc disorder with radiculopathy, unspecified cervical region: Secondary | ICD-10-CM

## 2015-07-08 NOTE — Therapy (Signed)
[facility] [address] [place], [place], [address] Phone: [phone]   Fax:  [phone]  Physical Therapy Treatment  Patient Details  Name: Theresa Munoz MRN: [id number] Date of Birth: Mar 29, 1956 Referring Provider: Dr. Rodell Perna  Encounter Date: 07/08/2015      PT End of Session - 07/08/15 1442    Visit Number 13   Date for PT Re-Evaluation 07/08/15   PT Start Time 1400   PT Stop Time 1500   PT Time Calculation (min) 60 min   Activity Tolerance Patient tolerated treatment well   Behavior During Therapy [facility] for tasks assessed/performed      Past Medical History  Diagnosis Date  . Allergy   . Thyroid disease   . Asthma   . Anemia   . Depression   . Fibromyalgia     History reviewed. No pertinent past surgical history.  There were no vitals filed for this visit.  Visit Diagnosis:  Cervical pain (neck)  Cervical disc disorder with radiculopathy of cervical region      Subjective Assessment - 07/08/15 1412    Subjective I am ready for discharge   Limitations Sitting   How long can you sit comfortably? 30 min.    Diagnostic tests x-ray showed spondylosis with osteophyte   Patient Stated Goals I have trouble tucking my chin down.    Currently in Pain? No/denies            [facility] PT Assessment - 07/08/15 0001    Assessment   Medical Diagnosis Cervical spondylosis, neck pain, left UE radiculopathy   Referring Provider Dr. Rodell Perna   Onset Date/Surgical Date 01/22/15   Hand Dominance Right   Prior Therapy yes-traction  over the door traction unit increased her TMJ pain   Precautions   Precautions None   Balance Screen   Has the patient fallen in the past 6 months No   Has the patient had a decrease in activity level because of a fear of falling?  No   Is the patient reluctant to leave their home because of a fear of falling?  No   Prior Function   Level of Independence Independent  with basic ADLs   Cognition   Overall Cognitive Status Within Functional Limits for tasks assessed   Observation/Other Assessments   Focus on Therapeutic Outcomes (FOTO)  45% limitation    ROM / Strength   AROM / PROM / Strength AROM   AROM   AROM Assessment Site Cervical   Cervical Flexion full   Cervical Extension decreased by 25%   Cervical - Right Side Bend full   Cervical - Left Side Bend full   Cervical - Right Rotation decreased by 10%   Cervical - Left Rotation decreased by 10%                     [facility] Adult PT Treatment/Exercise - 07/08/15 0001    Neck Exercises: Supine   Neck Retraction 15 reps;5 secs  head on red physioball   Cervical Rotation 10 reps;Both  with head on red physioball   Shoulder Flexion Left;Right;Weights;10 reps  with head retraction on red physioball   Shoulder Flexion Weights (lbs) 1   Shoulder ABduction 15 reps;Both;Other (comment);Weights  horizontal   Shoulder Abduction Weights (lbs) 1  yellow band   Upper Extremity D2 Extension;10 reps;Other (comment);Weights  yellow band   UE D2 Weights (lbs) 1   Traction   Type  of Traction Cervical   Min (lbs) 5   Max (lbs) 18   Time 15   Manual Therapy   Manual Therapy Soft tissue mobilization   Soft tissue mobilization bil. cervical paraspinals, upper trapezius, scalenes and suboccipital                PT Education - 07-09-2015 1439    Education provided Yes   Education Details verbally reviewed HEP and patient is independent   Person(s) Educated Patient   Methods Explanation   Comprehension Verbalized understanding          PT Short Term Goals - 06/03/15 1650    PT SHORT TERM GOAL #2   Title sit for 30 min in a car to travel with moderate pain   Time 3   Period Weeks   Status Achieved  Mild pain           PT Long Term Goals - July 09, 2015 1411    PT LONG TERM GOAL #1   Title independent with HEP    Time 6   Period Weeks   Status Achieved   PT LONG TERM  GOAL #2   Title sit in a car to travel for 1 hour with moderate pain   Time 6   Period Weeks   Status Achieved   PT LONG TERM GOAL #3   Title understand ways to manage her cervical pain with correct posture   Time 6   Period Weeks   Status Achieved   PT LONG TERM GOAL #4   Title return to regulary mild exercise for 3 days per week for 20 min   Time 6   Period Weeks   Status Achieved   PT LONG TERM GOAL #5   Title pain decreased to moderate consistently withdaily activities   Time 6   Period Weeks   Status Achieved               Plan - Jul 09, 2015 1420    Clinical Impression Statement Patient reports increased mobiity and decreased pain.  Patient is presently has no pain. Patient has met all of goals. Patient FOTO score improved 45% limitaiton.    Pt will benefit from skilled therapeutic intervention in order to improve on the following deficits Decreased range of motion;Increased fascial restricitons;Pain;Increased muscle spasms;Decreased endurance;Decreased activity tolerance;Impaired flexibility;Decreased mobility;Decreased strength   Rehab Potential Good   Clinical Impairments Affecting Rehab Potential None   PT Frequency 2x / week   PT Duration 6 weeks   PT Treatment/Interventions Other (comment);ADLs/Self Care Home Management;Cryotherapy;Electrical Stimulation;Moist Heat;Therapeutic exercise;Therapeutic activities;Ultrasound;Neuromuscular re-education;Patient/family education;Manual techniques;Passive range of motion;Traction   PT Next Visit Plan Discharge to HEP   PT Home Exercise Plan current   Consulted and Agree with Plan of Care Patient          G-Codes - 07/09/2015 1406    Functional Assessment Tool Used FOTO score is 45% limitation   Functional Limitation Changing and maintaining body position   Changing and Maintaining Body Position Goal Status (L0786) At least 40 percent but less than 60 percent impaired, limited or restricted   Changing and Maintaining Body  Position Discharge Status (L5449) At least 40 percent but less than 60 percent impaired, limited or restricted      Problem List Patient Active Problem List   Diagnosis Date Noted  . Atypical chest pain 02/19/2014  . Family history of ischemic heart disease 02/19/2014  . Pure hypercholesterolemia 02/19/2014  . Palpitations 02/19/2014  . Obesity, unspecified 02/19/2014  Earlie Counts, PT 07/08/2015 2:44 PM   [facility] [address], [place], [place], [address] Phone: [phone]   Fax:  [phone]  Name: BRIGITTE SODERBERG MRN: [id number] Date of Birth: 1955-09-27 PHYSICAL THERAPY DISCHARGE SUMMARY  Visits from Start of Care: 13  Current functional level related to goals / functional outcomes: See above   Remaining deficits: See above   Education / Equipment: HEP  Plan: Patient agrees to discharge.  Patient goals were met. Patient is being discharged due to meeting the stated rehab goals. Thank you for the referral. Earlie Counts, PT 07/08/2015 2:44 PM   ?????

## 2017-05-02 ENCOUNTER — Ambulatory Visit: Payer: Medicare Other | Admitting: Cardiology

## 2017-06-01 ENCOUNTER — Encounter: Payer: Self-pay | Admitting: Cardiology

## 2017-06-01 ENCOUNTER — Ambulatory Visit (INDEPENDENT_AMBULATORY_CARE_PROVIDER_SITE_OTHER): Payer: Medicare Other | Admitting: Cardiology

## 2017-06-01 VITALS — BP 124/80 | HR 80 | Ht 66.0 in | Wt 177.1 lb

## 2017-06-01 DIAGNOSIS — Z8249 Family history of ischemic heart disease and other diseases of the circulatory system: Secondary | ICD-10-CM

## 2017-06-01 DIAGNOSIS — E7800 Pure hypercholesterolemia, unspecified: Secondary | ICD-10-CM | POA: Diagnosis not present

## 2017-06-01 DIAGNOSIS — E663 Overweight: Secondary | ICD-10-CM | POA: Diagnosis not present

## 2017-06-01 DIAGNOSIS — R002 Palpitations: Secondary | ICD-10-CM | POA: Diagnosis not present

## 2017-06-01 NOTE — Progress Notes (Signed)
Cardiology Office Note:    Date:  06/01/2017   ID:  Theresa Munoz, DOB [birth year], MRN [id number]  PCP:  Pearson Grippe, MD  Cardiologist:  No primary care provider on file.   Referring MD: Pearson Grippe, MD     History of Present Illness:    Theresa Munoz is a 62 y.o. female here for the evaluation of palpitations at the request of Dr. Selena Batten.  Has been experiencing palpitations over the last several months, she was on a treadmill at one-point, no fainting episodes associated with this.  She did have mild shortness of breath and noted her heart rate to be greater than 200.  No associated angina, chest pain.  She is not on any medications to slow her heart rate.  Her father had PAD and died at age 38, mother died at age 66 with dementia, another brother has autism, coronary disease with MI at age 21 and underwent bypass surgery.  She has also been diagnosed with fibromyalgia.  I saw her last about 4 years ago.  Calcium score was performed and was 0.  Excellent.  Past Medical History:  Diagnosis Date  . Allergic rhinitis   . Allergy   . Anemia   . Asthma   . Atypical chest pain 02/19/2014  . Chronic fatigue syndrome   . Depression   . Family history of ischemic heart disease 02/19/2014  . Fibromyalgia   . GERD (gastroesophageal reflux disease)   . Herniated disc, cervical   . Hip pain, acute, left   . Hyperlipidemia   . Hypothyroidism   . Insomnia   . Lung granuloma (HCC)   . Mitral valve prolapse   . Obesity, unspecified 02/19/2014  . Osteoarthritis   . Pain in thumb joint with movement of left hand   . Palpitations 02/19/2014  . Polysubstance abuse (HCC)    SOBER SINCE 1989  . Pure hypercholesterolemia 02/19/2014  . Ruptured ovarian cyst   . SLE ([place] encephalitis)    INDUCED BY TRAZODONE  . Thyroid disease     Past Surgical History:  Procedure Laterality Date  . CHOLECYSTECTOMY    . COLONSCOPY    . LIPOMA EXCISION Bilateral    THIGHS  . TONSILLECTOMY AND  ADENOIDECTOMY      Current Medications: Current Meds  Medication Sig  . albuterol (PROVENTIL) (2.5 MG/3ML) 0.083% nebulizer solution Take 2.5 mg by nebulization every 6 (six) hours as needed for wheezing or shortness of breath.  Marland Kitchen almotriptan (AXERT) 12.5 MG tablet Take 12.5 mg by mouth as needed for migraine. may repeat in 2 hours if needed  . ARMOUR THYROID 30 MG tablet Take 60 mg by mouth daily before breakfast.   . buPROPion (WELLBUTRIN XL) 150 MG 24 hr tablet Take 150 mg by mouth daily.  . calcium citrate-vitamin D (CITRACAL+D) 315-200 MG-UNIT tablet Take 1 tablet by mouth 2 (two) times daily.  . Cholecalciferol (VITAMIN D3) 2000 UNITS capsule Take 3,000 Units by mouth daily.   . clonazePAM (KLONOPIN) 0.5 MG tablet Take 0.5 mg by mouth at bedtime.  . CONCERTA 54 MG CR tablet Take 54 mg by mouth every morning.   . diphenhydrAMINE (BENADRYL) 25 mg capsule Take 25 mg by mouth at bedtime as needed.  . diphenhydrAMINE (SOMINEX) 25 MG tablet Take 25 mg by mouth at bedtime as needed for sleep.  Marland Kitchen estradiol (ESTRACE) 0.1 MG/GM vaginal cream Place 1 Applicatorful vaginally at bedtime.  Marland Kitchen estradiol (ESTRACE) 0.5 MG tablet Take 0.5 mg  by mouth daily.  . fexofenadine (ALLEGRA) 180 MG tablet Take 180 mg by mouth daily.  Marland Kitchen FLUoxetine (PROZAC) 20 MG capsule Take 40 mg by mouth daily.   Marland Kitchen GINKGO BILOBA PO Take by mouth. Ginkgo Biloba with Vinpocetine  . Guaifenesin (MUCINEX MAXIMUM STRENGTH) 1200 MG TB12 Take 1,200 mg by mouth 2 (two) times daily.  Marland Kitchen L-Methylfolate 15 MG TABS Take 15 mg by mouth daily.  Marland Kitchen L-Methylfolate-Algae (DEPLIN 7.5 PO) Take 7.5 mg by mouth.  . levETIRAcetam (KEPPRA) 750 MG tablet Take 1,500 mg by mouth 2 (two) times daily.   Marland Kitchen levothyroxine (SYNTHROID, LEVOTHROID) 25 MCG tablet Take 25 mcg by mouth daily.  . Magnesium 400 MG TABS Take 500 mg by mouth daily.  . Melatonin 3 MG CAPS Take 3 mg by mouth as needed.  . meloxicam (MOBIC) 7.5 MG tablet Take 7.5 mg by mouth 2 (two)  times daily.  . methylphenidate 27 MG PO CR tablet Take 27 mg by mouth daily.  . Misc Natural Products (GRAPE SEED COMPLEX PO) Take by mouth. Grape Seed & Resveratrol  . mometasone (NASONEX) 50 MCG/ACT nasal spray Place 2 sprays into the nose 2 (two) times daily.  . montelukast (SINGULAIR) 10 MG tablet Take 10 mg by mouth as needed.  Marland Kitchen NEXIUM 40 MG capsule Take 80 mg by mouth 2 (two) times daily before a meal.   . Probiotic Product (ALIGN) 4 MG CAPS Take 4 mg by mouth daily.  . riboflavin (VITAMIN B-2) 100 MG TABS tablet Take 100 mg by mouth daily.  . Tiotropium Bromide Monohydrate (SPIRIVA RESPIMAT) 2.5 MCG/ACT AERS Inhale into the lungs as needed.  . traMADol-acetaminophen (ULTRACET) 37.5-325 MG tablet Take 1 tablet by mouth every 6 (six) hours as needed.  . Tretinoin, Facial Wrinkles, (TRETINOIN, EMOLLIENT,) 0.05 % CREA Apply topically.  Marland Kitchen zolpidem (AMBIEN) 10 MG tablet Take 5 mg by mouth at bedtime as needed for sleep.     Allergies:   Depo-medrol [methylprednisolone]; Polymyxin b; and Trazodone and nefazodone   Social History   Socioeconomic History  . Marital status: Single    Spouse name: None  . Number of children: None  . Years of education: None  . Highest education level: None  Social Needs  . Financial resource strain: None  . Food insecurity - worry: None  . Food insecurity - inability: None  . Transportation needs - medical: None  . Transportation needs - non-medical: None  Occupational History  . None  Tobacco Use  . Smoking status: Former Games developer  . Smokeless tobacco: Never Used  Substance and Sexual Activity  . Alcohol use: No    Frequency: Never  . Drug use: No  . Sexual activity: None  Other Topics Concern  . None  Social History Narrative  . None     Family History: The patient's family history includes Alcohol abuse in her brother; Allergies in her brother and sister; Arthritis in her father and mother; Asperger's syndrome in her brother; Asthma in  her father; Autism in her brother; CAD in her brother; Dementia in her mother; Depression in her father; Heart attack in her brother; High Cholesterol in her father and sister; Hypertension in her brother, father, mother, and sister; Migraines in her mother; Mitral valve prolapse in her father; Obesity in her brother and sister; Peripheral Artery Disease in her father; Transient ischemic attack in her mother.  ROS:   Please see the history of present illness.     All other systems reviewed  and are negative.  EKGs/Labs/Other Studies Reviewed:    The following studies were reviewed today: Coronary calcium score reviewed, EKG reviewed, lab work reviewed, prior office note reviewed  EKG:  EKG is  ordered today.  The ekg ordered today demonstrates NSR no other changes, personally viewed.   Recent Labs: No results found for requested labs within last 8760 hours.  Recent Lipid Panel No results found for: CHOL, TRIG, HDL, CHOLHDL, VLDL, LDLCALC, LDLDIRECT  Physical Exam:    VS:  BP 124/80   Pulse 80   Ht 5\' 6"  (1.676 m)   Wt 177 lb 1.9 oz (80.3 kg)   SpO2 97%   BMI 28.59 kg/m     Wt Readings from Last 3 Encounters:  06/01/17 177 lb 1.9 oz (80.3 kg)  02/19/14 191 lb (86.6 kg)     GEN:  Well nourished, well developed in no acute distress HEENT: Normal NECK: No JVD; No carotid bruits LYMPHATICS: No lymphadenopathy CARDIAC: RRR, no murmurs, rubs, gallops RESPIRATORY:  Clear to auscultation without rales, wheezing or rhonchi  ABDOMEN: Soft, non-tender, non-distended MUSCULOSKELETAL:  No edema; No deformity  SKIN: Warm and dry NEUROLOGIC:  Alert and oriented x 3 PSYCHIATRIC:  Normal affect   ASSESSMENT:    1. Palpitation   2. Family history of early CAD    PLAN:    In order of problems listed above:  Palpitations -Heart rate dramatically increasing to greater than 200 during early exercise on treadmill may be PSVT.  I will order her an exercise treadmill test for further  evaluation.  Her sister has SVT.  She takes Arts development officerBystolic.  She was also contemplating getting an apple watch for.  This may be helpful as well.  We discussed.  Family history of CAD/hyperlipidemia -LDL 159, HDL 50, triglycerides 101.  I would continue to advocate for diet, exercise.  Prior calcium score was 0.  We will follow-up with results of study.   Medication Adjustments/Labs and Tests Ordered: Current medicines are reviewed at length with the patient today.  Concerns regarding medicines are outlined above.  No orders of the defined types were placed in this encounter.  No orders of the defined types were placed in this encounter.   Signed, Donato SchultzMark Medardo Hassing, MD  06/01/2017 2:30 PM     [facility]

## 2017-06-01 NOTE — Patient Instructions (Signed)
Medication Instructions:  The current medical regimen is effective;  continue present plan and medications.  Testing/Procedures: Your physician has requested that you have an exercise tolerance test. For further information please visit [email or web address] Please also follow instruction sheet, as given.  You have been referred for a Nutritional Consult.  Follow-Up: Follow up as needed after the above testing.  If you need a refill on your cardiac medications before your next appointment, please call your pharmacy.  Thank you for choosing [facility]!!

## 2017-06-06 ENCOUNTER — Ambulatory Visit (INDEPENDENT_AMBULATORY_CARE_PROVIDER_SITE_OTHER): Payer: Medicare Other

## 2017-06-06 DIAGNOSIS — Z8249 Family history of ischemic heart disease and other diseases of the circulatory system: Secondary | ICD-10-CM | POA: Diagnosis not present

## 2017-06-06 DIAGNOSIS — R002 Palpitations: Secondary | ICD-10-CM

## 2017-06-06 LAB — EXERCISE TOLERANCE TEST
CHL CUP MPHR: 161 {beats}/min
CHL CUP RESTING HR STRESS: 88 {beats}/min
CSEPPHR: 166 {beats}/min
Estimated workload: 8.5 METS
Exercise duration (min): 7 min
Exercise duration (sec): 0 s
Percent HR: 103 %
RPE: 17

## 2020-03-26 ENCOUNTER — Ambulatory Visit (INDEPENDENT_AMBULATORY_CARE_PROVIDER_SITE_OTHER): Payer: Medicare Other

## 2020-03-26 ENCOUNTER — Encounter: Payer: Self-pay | Admitting: Podiatry

## 2020-03-26 ENCOUNTER — Other Ambulatory Visit: Payer: Self-pay

## 2020-03-26 ENCOUNTER — Ambulatory Visit (INDEPENDENT_AMBULATORY_CARE_PROVIDER_SITE_OTHER): Payer: Medicare Other | Admitting: Podiatry

## 2020-03-26 DIAGNOSIS — M778 Other enthesopathies, not elsewhere classified: Secondary | ICD-10-CM | POA: Diagnosis not present

## 2020-03-26 NOTE — Progress Notes (Signed)
Subjective:  Patient ID: Theresa Munoz, female    DOB: Aug 09, 1955,  MRN: [id number] HPI Chief Complaint  Patient presents with  . Foot Pain    Dorsal midfoot bilateral - aching, swelling, knotty x 6 months, (L>R), no injury, radiating pain into the 3rd and 4th toes, xrays done-"said was fine"  . New Patient (Initial Visit)    64 y.o. female presents with the above complaint.   ROS: Denies fever chills nausea vomiting muscle aches pains calf pain back pain chest pain shortness of breath.  Past Medical History:  Diagnosis Date  . Allergic rhinitis   . Allergy   . Anemia   . Asthma   . Atypical chest pain 02/19/2014  . Chronic fatigue syndrome   . Depression   . Family history of ischemic heart disease 02/19/2014  . Fibromyalgia   . GERD (gastroesophageal reflux disease)   . Herniated disc, cervical   . Hip pain, acute, left   . Hyperlipidemia   . Hypothyroidism   . Insomnia   . Lung granuloma (HCC)   . Mitral valve prolapse   . Obesity, unspecified 02/19/2014  . Osteoarthritis   . Pain in thumb joint with movement of left hand   . Palpitations 02/19/2014  . Polysubstance abuse (HCC)    SOBER SINCE 1989  . Pure hypercholesterolemia 02/19/2014  . Ruptured ovarian cyst   . SLE ([place] encephalitis)    INDUCED BY TRAZODONE  . Thyroid disease    Past Surgical History:  Procedure Laterality Date  . CHOLECYSTECTOMY    . COLONSCOPY    . LIPOMA EXCISION Bilateral    THIGHS  . TONSILLECTOMY AND ADENOIDECTOMY      Current Outpatient Medications:  .  ADVAIR DISKUS 100-50 MCG/DOSE AEPB, Inhale 1 puff into the lungs 2 (two) times daily., Disp: , Rfl:  .  albuterol (PROVENTIL) (2.5 MG/3ML) 0.083% nebulizer solution, Take 2.5 mg by nebulization every 6 (six) hours as needed for wheezing or shortness of breath., Disp: , Rfl:  .  ARMOUR THYROID 30 MG tablet, Take 60 mg by mouth daily before breakfast. , Disp: , Rfl:  .  Ascorbic Acid (VITAMIN C PO), Take by mouth., Disp: ,  Rfl:  .  calcium citrate-vitamin D (CITRACAL+D) 315-200 MG-UNIT tablet, Take 1 tablet by mouth 2 (two) times daily., Disp: , Rfl:  .  clonazePAM (KLONOPIN) 0.5 MG tablet, Take 0.5 mg by mouth at bedtime., Disp: , Rfl:  .  clotrimazole-betamethasone (LOTRISONE) cream, Apply 1 application topically 2 (two) times daily., Disp: , Rfl:  .  CONCERTA 54 MG CR tablet, Take 54 mg by mouth every morning. , Disp: , Rfl:  .  diphenhydrAMINE (BENADRYL) 25 mg capsule, Take 25 mg by mouth at bedtime as needed., Disp: , Rfl:  .  fexofenadine (ALLEGRA) 180 MG tablet, Take 180 mg by mouth daily., Disp: , Rfl:  .  FLUoxetine (PROZAC) 20 MG capsule, Take 40 mg by mouth daily. , Disp: , Rfl:  .  fluticasone (FLONASE) 50 MCG/ACT nasal spray, Place 2 sprays into both nostrils daily., Disp: , Rfl:  .  folic acid (FOLVITE) 1 MG tablet, Take 1 mg by mouth daily., Disp: , Rfl:  .  GINKGO BILOBA PO, Take by mouth. Ginkgo Biloba with Vinpocetine, Disp: , Rfl:  .  Guaifenesin (MUCINEX MAXIMUM STRENGTH) 1200 MG TB12, Take 1,200 mg by mouth 2 (two) times daily., Disp: , Rfl:  .  hydroxychloroquine (PLAQUENIL) 200 MG tablet, Take 400 mg by mouth daily.,  Disp: , Rfl:  .  L-Methylfolate (DEPLIN PO), Take by mouth., Disp: , Rfl:  .  L-Methylfolate-Algae (DEPLIN 7.5 PO), Take 7.5 mg by mouth., Disp: , Rfl:  .  levETIRAcetam (KEPPRA) 750 MG tablet, Take 1,500 mg by mouth 2 (two) times daily. , Disp: , Rfl:  .  Lysine HCl 1000 MG TABS, Take by mouth., Disp: , Rfl:  .  meloxicam (MOBIC) 7.5 MG tablet, Take 7.5 mg by mouth 2 (two) times daily., Disp: , Rfl:  .  Menaquinone-7 (VITAMIN K2 PO), Take by mouth., Disp: , Rfl:  .  methotrexate (RHEUMATREX) 2.5 MG tablet, Take 10 mg by mouth once a week., Disp: , Rfl:  .  montelukast (SINGULAIR) 10 MG tablet, Take 10 mg by mouth as needed., Disp: , Rfl:  .  NEXIUM 40 MG capsule, Take 80 mg by mouth 2 (two) times daily before a meal. , Disp: , Rfl:  .  Pantethine ER 300 MG TBCR, Take by  mouth., Disp: , Rfl:  .  Probiotic Product (ALIGN) 4 MG CAPS, Take 4 mg by mouth daily., Disp: , Rfl:  .  Red Yeast Rice Extract (RED YEAST RICE PO), Take by mouth., Disp: , Rfl:  .  riboflavin (VITAMIN B-2) 100 MG TABS tablet, Take 100 mg by mouth daily., Disp: , Rfl:  .  traMADol-acetaminophen (ULTRACET) 37.5-325 MG tablet, Take 1 tablet by mouth every 6 (six) hours as needed., Disp: , Rfl:  .  Tretinoin, Facial Wrinkles, (TRETINOIN, EMOLLIENT,) 0.05 % CREA, Apply topically., Disp: , Rfl:  .  zolpidem (AMBIEN) 10 MG tablet, Take 5 mg by mouth at bedtime as needed for sleep., Disp: , Rfl:   Allergies  Allergen Reactions  . Depo-Medrol [Methylprednisolone] Swelling    SWELLING IN FOOT  . Polymyxin B   . Trazodone And Nefazodone    Review of Systems Objective:  There were no vitals filed for this visit.  General: Well developed, nourished, in no acute distress, alert and oriented x3   Dermatological: Skin is warm, dry and supple bilateral. Nails x 10 are well maintained; remaining integument appears unremarkable at this time. There are no open sores, no preulcerative lesions, no rash or signs of infection present.  Vascular: Dorsalis Pedis artery and Posterior Tibial artery pedal pulses are 2/4 bilateral with immedate capillary fill time. Pedal hair growth present. No varicosities and no lower extremity edema present bilateral.   Neruologic: Grossly intact via light touch bilateral. Vibratory intact via tuning fork bilateral. Protective threshold with Semmes Wienstein monofilament intact to all pedal sites bilateral. Patellar and Achilles deep tendon reflexes 2+ bilateral. No Babinski or clonus noted bilateral.   Musculoskeletal: No gross boney pedal deformities bilateral. No pain, crepitus, or limitation noted with foot and ankle range of motion bilateral. Muscular strength 5/5 in all groups tested bilateral.  She has pain on palpation dorsal aspect of the forefoot with frontal plane range  of motion.  Pain on palpation of the deep peroneal nerve.  Gait: Unassisted, Nonantalgic.    Radiographs:  Radiographs taken today demonstrate osteoarthritic changes of the midfoot particularly of the tarsometatarsal joints bilaterally left greater than right.  Assessment & Plan:   Assessment: Capsulitis with osteoarthritis midfoot bilaterally.  Plan: Discussed the possible need for orthotics discussed topical analgesics and anti-inflammatories.  Also injected the dorsal aspect of the bilateral foot today at her request utilizing Kenalog and local anesthetic.  She tolerated procedure well and I will follow-up with her in 4 to 6 weeks.  Garrel Ridgel, DPM

## 2020-03-27 ENCOUNTER — Encounter: Payer: Self-pay | Admitting: Podiatry

## 2020-04-23 ENCOUNTER — Encounter: Payer: Self-pay | Admitting: Podiatry

## 2020-04-23 ENCOUNTER — Other Ambulatory Visit: Payer: Self-pay

## 2020-04-23 ENCOUNTER — Ambulatory Visit (INDEPENDENT_AMBULATORY_CARE_PROVIDER_SITE_OTHER): Payer: Medicare Other | Admitting: Podiatry

## 2020-04-23 DIAGNOSIS — M778 Other enthesopathies, not elsewhere classified: Secondary | ICD-10-CM | POA: Diagnosis not present

## 2020-04-25 NOTE — Progress Notes (Signed)
She presents today for follow-up of capsulitis bilaterally states that they are doing better as she refers to the dorsal aspect of the bilateral foot.  She states that she feels like she is improving all the time.  Objective: Vital signs are stable she is alert and oriented x3.  Pulses are palpable.  No changes in physical exam.  Much decrease in edema to the dorsal aspect of the foot is the only change noticed.  She also does not have a lot of pain on palpation.  Assessment: 80% resolved resolving capsulitis dorsal aspect bilateral foot secondary to osteoarthritis and peroneal nerve neuritis.  Plan: Discussed appropriate shoe gear stretching exercises ice therapy shoe gear modifications topical therapies with her today she understands this is amenable to it we will follow-up with me in the near future as needed.

## 2022-02-06 LAB — EXTERNAL GENERIC LAB PROCEDURE: COLOGUARD: POSITIVE — AB

## 2023-04-04 NOTE — Progress Notes (Unsigned)
67 y.o. [id number] Married Caucasian female here for a NEW GYN//breast and pelvic exam.    The patient is also seen for establishing care today.  Left breast is larger than then right and has a lump under her left arm.   Did have recent immunizations, one in each arm.   Has vaginal atrophy.  Using hyaluronic acid, vit E combination.  Estrace cream helpful in the past.   Not sexually active due to pain.   No vaginal bleeding.   Hx depression.  Recently restarted her ADHD meds. She is in therapy.  She denies suicidal ideation.  She has support of her therapist and her husband.  Moved from [place]. Currently disabled.  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   PCP: Irena Reichmann, DO  Rheumatology:  Dr. Kathlen Mody at [facility]  Patient's last menstrual period was 09/21/2011.        Sexually active: No.  The current method of family planning is post menopausal status.    Menopausal hormone therapy:  n/a Exercising: No.   Smoker:  former  OB History   No obstetric history on file.     HEALTH MAINTENANCE: Pap:  2023 per pt - normal.  Uncertain if had HR HPV testing done.  No results found for: "DIAGPAP", "HPVHIGH", "ADEQPAP" History of abnormal Pap or positive HPV:  yes, in 20',s followed with colpo.   Mammogram: 2023 per pt at [facility]. Colonoscopy:  cologuard 02/01/22 positive Bone Density:  4 years ago per pt  Result  osteopenia.  Normal with rheumatology.   Immunization History  Administered Date(s) Administered   Influenza-Unspecified 03/03/2020      reports that she has quit smoking. She has never used smokeless tobacco. She reports that she does not drink alcohol and does not use drugs.  Past Medical History:  Diagnosis Date   ADD (attention deficit disorder)    Allergic rhinitis    Allergy    Anemia    Asthma    Asthma    Atypical chest pain 02/19/2014   Chronic fatigue syndrome    Delayed sleep phase syndrome    Depression     Environmental and seasonal allergies    Family history of ischemic heart disease 02/19/2014   Fibromyalgia    Fibromyalgia    GERD (gastroesophageal reflux disease)    Herniated disc, cervical    Hip pain, acute, left    Hyperlipidemia    Hypothyroidism    Hypothyroidism    IBS (irritable bowel syndrome)    Insomnia    Insomnia    Intestinal malabsorption of protein    Laryngopharyngeal reflux (LPR)    Lung granuloma (HCC)    Mitral valve prolapse    Obesity, unspecified 02/19/2014   Osteoarthritis    Pain in thumb joint with movement of left hand    Palpitations 02/19/2014   Polysubstance abuse (HCC)    SOBER SINCE 1989   Pure hypercholesterolemia 02/19/2014   RA (rheumatoid arthritis) (HCC)    Ruptured ovarian cyst    SLE ([place] encephalitis)    INDUCED BY TRAZODONE   Thyroid disease     Past Surgical History:  Procedure Laterality Date   CHOLECYSTECTOMY     COLONSCOPY     LIPOMA EXCISION Bilateral    THIGHS   TONSILLECTOMY AND ADENOIDECTOMY      Current Outpatient Medications  Medication Sig Dispense Refill   ARMOUR THYROID 30 MG tablet Take 60 mg by mouth daily before breakfast.  AZSTARYS 26.1-5.2 MG CAPS Take 1 capsule by mouth daily.     buPROPion (WELLBUTRIN XL) 150 MG 24 hr tablet Take 150 mg by mouth 2 (two) times daily.     fexofenadine (ALLEGRA) 180 MG tablet Take 180 mg by mouth daily.     FLUoxetine (PROZAC) 20 MG capsule Take 40 mg by mouth daily.      fluticasone (FLONASE) 50 MCG/ACT nasal spray Place 2 sprays into both nostrils daily.     folic acid (FOLVITE) 1 MG tablet Take 1 mg by mouth daily.     Guaifenesin (MUCINEX MAXIMUM STRENGTH) 1200 MG TB12 Take 1,200 mg by mouth 2 (two) times daily.     hydroxychloroquine (PLAQUENIL) 200 MG tablet Take 400 mg by mouth daily.     levETIRAcetam (KEPPRA) 750 MG tablet Take 1,500 mg by mouth 2 (two) times daily.      Lysine HCl 1000 MG TABS Take by mouth.     meloxicam (MOBIC) 7.5 MG tablet Take  7.5 mg by mouth 2 (two) times daily.     Menaquinone-7 (VITAMIN K2 PO) Take by mouth.     montelukast (SINGULAIR) 10 MG tablet Take 10 mg by mouth as needed.     NEXIUM 40 MG capsule Take 80 mg by mouth 2 (two) times daily before a meal.      Probiotic Product (ALIGN) 4 MG CAPS Take 4 mg by mouth daily.     Red Yeast Rice Extract (RED YEAST RICE PO) Take by mouth.     riboflavin (VITAMIN B-2) 100 MG TABS tablet Take 100 mg by mouth daily.     traMADol (ULTRAM) 50 MG tablet Take 50 mg by mouth every 6 (six) hours as needed.     traMADol-acetaminophen (ULTRACET) 37.5-325 MG tablet Take 1 tablet by mouth every 6 (six) hours as needed.     Tretinoin, Facial Wrinkles, (TRETINOIN, EMOLLIENT,) 0.05 % CREA Apply topically.     zolpidem (AMBIEN) 10 MG tablet Take 5 mg by mouth at bedtime as needed for sleep.     methotrexate (RHEUMATREX) 2.5 MG tablet Take 10 mg by mouth once a week. (Patient not taking: Reported on 04/18/2023)     No current facility-administered medications for this visit.    ALLERGIES: Depo-medrol [methylprednisolone], Polymyxin b, and Trazodone and nefazodone  Family History  Problem Relation Age of Onset   Dementia Mother    Hypertension Mother    Migraines Mother    Transient ischemic attack Mother    Arthritis Mother    Peripheral Artery Disease Father    Mitral valve prolapse Father    Depression Father    Arthritis Father    Asthma Father    High Cholesterol Father    Hypertension Father    High Cholesterol Sister    Hypertension Sister    Obesity Sister    Allergies Sister    Hypertension Brother    Obesity Brother    Allergies Brother    Alcohol abuse Brother    Asperger's syndrome Brother    Autism Brother    CAD Brother    Heart attack Brother     Review of Systems  All other systems reviewed and are negative.   PHYSICAL EXAM:  BP 128/84 (BP Location: Left Arm, Patient Position: Sitting, Cuff Size: Normal)   Pulse (!) 112   Ht 5' 5.75" (1.67  m)   Wt 214 lb (97.1 kg)   LMP 09/21/2011   SpO2 96%   BMI 34.80 kg/m  General appearance: alert, cooperative and appears stated age Head: normocephalic, without obvious abnormality, atraumatic Neck: no adenopathy, supple, symmetrical, trachea midline and thyroid normal to inspection and palpation Lungs: clear to auscultation bilaterally Breasts: normal appearance, no masses or tenderness, No nipple retraction or dimpling, No nipple discharge or bleeding, No axillary adenopathy on right.  Left axillary mass 2 cm, soft, and nontender. Heart: regular rate and rhythm Abdomen: soft, non-tender; no masses, no organomegaly Extremities: extremities normal, atraumatic, no cyanosis or edema Skin: skin color, texture, turgor normal. No rashes or lesions Lymph nodes: cervical, supraclavicular, and axillary nodes normal. Neurologic: grossly normal  Pelvic: External genitalia:  no lesions              No abnormal inguinal nodes palpated.              Urethra:  normal appearing urethra with no masses, tenderness or lesions              Bartholins and Skenes: normal                 Vagina: normal appearing vagina with normal color and discharge, no lesions              Cervix: no lesions              Pap taken: Yes.   Bimanual Exam:  Uterus:  normal size, contour, position, consistency, mobility, non-tender              Adnexa: no mass, fullness, tenderness              Rectal exam: Yes.  .  Confirms.              Anus:  normal sphincter tone, no lesions  Chaperone was present for exam:  Warren Lacy, CMA  PHQ-9 score:  17.   ASSESSMENT: Encounter for breast and pelvic exam.  High risk Medicare GYN exam.  Cervical cancer screening.  Left breast enlargement noted by patient.  No mass noted in the breast with my exam. Left axillary mass.  Vaginal atrophy.  MVP.   PLAN: Dx bilateral mammogram and left breast/axillary [place] at [facility]. Self breast awareness reviewed. Pap and HRV  collected:  Yes.   Guidelines for Calcium, Vitamin D, regular exercise program including cardiovascular and weight bearing exercise. Medication refills:  NA. We discussed potential tx for vaginal atrophy:  cooking oils, vaginal estrogen, [place].  No Rx given today.  Will need to complete breast evaluation first.  Follow up:  1 year and prn.  35 min  total time was spent for this patient encounter, including preparation, face-to-face counseling with the patient, coordination of care, and documentation of the encounter in addition to doing the breast and pelvic exam.   An After Visit Summary was provided to the patient.

## 2023-04-18 ENCOUNTER — Encounter: Payer: Self-pay | Admitting: Obstetrics and Gynecology

## 2023-04-18 ENCOUNTER — Other Ambulatory Visit (HOSPITAL_COMMUNITY)
Admission: RE | Admit: 2023-04-18 | Discharge: 2023-04-18 | Disposition: A | Discharge status: Home / Self Care | Payer: Medicare Other | Source: Ambulatory Visit | Attending: Obstetrics and Gynecology | Admitting: Obstetrics and Gynecology

## 2023-04-18 ENCOUNTER — Ambulatory Visit (INDEPENDENT_AMBULATORY_CARE_PROVIDER_SITE_OTHER): Payer: Medicare Other | Admitting: Obstetrics and Gynecology

## 2023-04-18 VITALS — BP 128/84 | HR 112 | Ht 65.75 in | Wt 214.0 lb

## 2023-04-18 DIAGNOSIS — Z124 Encounter for screening for malignant neoplasm of cervix: Secondary | ICD-10-CM

## 2023-04-18 DIAGNOSIS — Z01419 Encounter for gynecological examination (general) (routine) without abnormal findings: Secondary | ICD-10-CM

## 2023-04-18 DIAGNOSIS — Z1151 Encounter for screening for human papillomavirus (HPV): Secondary | ICD-10-CM | POA: Diagnosis not present

## 2023-04-18 DIAGNOSIS — N952 Postmenopausal atrophic vaginitis: Secondary | ICD-10-CM

## 2023-04-18 DIAGNOSIS — Z9189 Other specified personal risk factors, not elsewhere classified: Secondary | ICD-10-CM

## 2023-04-18 DIAGNOSIS — R2232 Localized swelling, mass and lump, left upper limb: Secondary | ICD-10-CM

## 2023-04-19 ENCOUNTER — Telehealth: Payer: Self-pay | Admitting: Obstetrics and Gynecology

## 2023-04-19 DIAGNOSIS — R2232 Localized swelling, mass and lump, left upper limb: Secondary | ICD-10-CM

## 2023-04-19 DIAGNOSIS — N649 Disorder of breast, unspecified: Secondary | ICD-10-CM

## 2023-04-19 NOTE — Telephone Encounter (Signed)
Please schedule [facility] bilateral mamm[facility] and [facility].  My patient has a 2 cm left axillary mass.  She notes that her left breast greater in size than right breast.

## 2023-04-19 NOTE — Telephone Encounter (Signed)
Dr. Edward Jolly -orders pended. Please review order validation upon signing.

## 2023-04-19 NOTE — Telephone Encounter (Signed)
Spoke with [employer] at [facility]. Patient scheduled for Bilateral Dx MMG and left breast US on 05/08/23 at 1030.   Patient notified.  Routing to provider for final review. Patient is agreeable to disposition. Will close encounter.

## 2023-04-19 NOTE — Telephone Encounter (Signed)
I modified and signed orders for dx bilateral mammogram and left breast US.

## 2023-04-21 LAB — CYTOLOGY - PAP
Adequacy: ABSENT
Comment: NEGATIVE
Diagnosis: NEGATIVE
High risk HPV: NEGATIVE

## 2023-05-08 ENCOUNTER — Ambulatory Visit
Admission: RE | Admit: 2023-05-08 | Discharge: 2023-05-08 | Disposition: A | Discharge status: Home / Self Care | Payer: Medicare Other | Source: Ambulatory Visit | Attending: Obstetrics and Gynecology | Admitting: Obstetrics and Gynecology

## 2023-05-08 ENCOUNTER — Ambulatory Visit
Admission: RE | Admit: 2023-05-08 | Discharge: 2023-05-08 | Disposition: A | Discharge status: Home / Self Care | Payer: 59 | Source: Ambulatory Visit | Attending: Obstetrics and Gynecology | Admitting: Obstetrics and Gynecology

## 2023-05-08 DIAGNOSIS — N649 Disorder of breast, unspecified: Secondary | ICD-10-CM

## 2023-05-08 DIAGNOSIS — R2232 Localized swelling, mass and lump, left upper limb: Secondary | ICD-10-CM

## 2023-06-06 NOTE — Progress Notes (Unsigned)
GYNECOLOGY  VISIT   HPI: 68 y.o.   Single  Caucasian female   No obstetric history on file. with Patient's last menstrual period was 09/21/2011.   here for: 6 week for breast check/consult for vaginal atrophy   Breast exam 04/18/23: Breasts: normal appearance, no masses or tenderness, No nipple retraction or dimpling, No nipple discharge or bleeding, No axillary adenopathy on right.  Left axillary mass 2 cm, soft, and nontender.   [facility] on [facility]23.   Patient states that the left axillary lump is now larger.  Is it somewhat painful.  Feels she has a mass on top of the tendon under her arm and is most identified when she is lying down.   State she had a fall prior to her visit on 04/18/23.   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   She has vit E and Hyaluronic acid for vaginal atrophy.  Has used vaginal estrogen cream in the past.   GYNECOLOGIC HISTORY: Patient's last menstrual period was 09/21/2011. Contraception:  PMP Menopausal hormone therapy:  n/a Last 2 paps:  2023 per pt--normal History of abnormal Pap or positive HPV:  yes, in 20',s followed with colpo.   Mammogram: 05/08/23 Breast Density Cat B, BI-RADS CAT 1 neg        OB History   No obstetric history on file.        Patient Active Problem List   Diagnosis Date Noted   Atypical chest pain 02/19/2014   Family history of ischemic heart disease 02/19/2014   Pure hypercholesterolemia 02/19/2014   Palpitations 02/19/2014   Obesity, unspecified 02/19/2014    Past Medical History:  Diagnosis Date   ADD (attention deficit disorder)    Allergic rhinitis    Allergy    Anemia    Asthma    Asthma    Atypical chest pain 02/19/2014   Chronic fatigue syndrome    Delayed sleep phase syndrome    Depression    Environmental and seasonal allergies    Family history of ischemic heart disease 02/19/2014    Fibromyalgia    Fibromyalgia    GERD (gastroesophageal reflux disease)    Herniated disc, cervical    Hip pain, acute, left    Hyperlipidemia    Hypothyroidism    Hypothyroidism    IBS (irritable bowel syndrome)    Insomnia    Insomnia    Intestinal malabsorption of protein    Laryngopharyngeal reflux (LPR)    Lung granuloma (HCC)    Mitral valve prolapse    Obesity, unspecified 02/19/2014   Osteoarthritis    Pain in thumb joint with movement of left hand    Palpitations 02/19/2014   Polysubstance abuse (HCC)    SOBER SINCE 1989   Pure hypercholesterolemia 02/19/2014   RA (rheumatoid arthritis) (HCC)    Ruptured ovarian cyst    SLE ([place] encephalitis)    INDUCED BY TRAZODONE   Thyroid disease     Past Surgical History:  Procedure Laterality Date   CHOLECYSTECTOMY     COLONSCOPY     LIPOMA EXCISION Bilateral    THIGHS   TONSILLECTOMY AND ADENOIDECTOMY      Current Outpatient Medications  Medication Sig Dispense Refill   ARMOUR THYROID 30 MG tablet Take 60 mg by mouth daily before breakfast.      AZSTARYS 26.1-5.2 MG CAPS Take 1 capsule by mouth daily.  buPROPion (WELLBUTRIN XL) 150 MG 24 hr tablet Take 150 mg by mouth 2 (two) times daily.     fexofenadine (ALLEGRA) 180 MG tablet Take 180 mg by mouth daily.     FLUoxetine (PROZAC) 20 MG capsule Take 40 mg by mouth daily.      fluticasone (FLONASE) 50 MCG/ACT nasal spray Place 2 sprays into both nostrils daily.     folic acid (FOLVITE) 1 MG tablet Take 1 mg by mouth daily.     Guaifenesin (MUCINEX MAXIMUM STRENGTH) 1200 MG TB12 Take 1,200 mg by mouth 2 (two) times daily.     hydroxychloroquine (PLAQUENIL) 200 MG tablet Take 400 mg by mouth daily.     levETIRAcetam (KEPPRA) 750 MG tablet Take 1,500 mg by mouth 2 (two) times daily.      Lysine HCl 1000 MG TABS Take by mouth.     meloxicam (MOBIC) 7.5 MG tablet Take 7.5 mg by mouth 2 (two) times daily.     Menaquinone-7 (VITAMIN K2 PO) Take by mouth.      methotrexate (RHEUMATREX) 2.5 MG tablet Take 10 mg by mouth once a week.     montelukast (SINGULAIR) 10 MG tablet Take 10 mg by mouth as needed.     NEXIUM 40 MG capsule Take 80 mg by mouth 2 (two) times daily before a meal.      Probiotic Product (ALIGN) 4 MG CAPS Take 4 mg by mouth daily.     Red Yeast Rice Extract (RED YEAST RICE PO) Take by mouth.     riboflavin (VITAMIN B-2) 100 MG TABS tablet Take 100 mg by mouth daily.     traMADol (ULTRAM) 50 MG tablet Take 50 mg by mouth every 6 (six) hours as needed.     traMADol-acetaminophen (ULTRACET) 37.5-325 MG tablet Take 1 tablet by mouth every 6 (six) hours as needed.     Tretinoin, Facial Wrinkles, (TRETINOIN, EMOLLIENT,) 0.05 % CREA Apply topically.     zolpidem (AMBIEN) 10 MG tablet Take 5 mg by mouth at bedtime as needed for sleep.     No current facility-administered medications for this visit.     ALLERGIES: Depo-medrol [methylprednisolone], Polymyxin b, and Trazodone and nefazodone  Family History  Problem Relation Age of Onset   Dementia Mother    Hypertension Mother    Migraines Mother    Transient ischemic attack Mother    Arthritis Mother    Peripheral Artery Disease Father    Mitral valve prolapse Father    Depression Father    Arthritis Father    Asthma Father    High Cholesterol Father    Hypertension Father    High Cholesterol Sister    Hypertension Sister    Obesity Sister    Allergies Sister    Hypertension Brother    Obesity Brother    Allergies Brother    Alcohol abuse Brother    Asperger's syndrome Brother    Autism Brother    CAD Brother    Heart attack Brother     Social History   Socioeconomic History   Marital status: Single    Spouse name: Not on file   Number of children: Not on file   Years of education: Not on file   Highest education level: Not on file  Occupational History   Not on file  Tobacco Use   Smoking status: Former   Smokeless tobacco: Never  Substance and Sexual  Activity   Alcohol use: No   Drug use: No  Sexual activity: Not Currently    Birth control/protection: Post-menopausal    Comment: more than 5, before 18, no STD, no abnormal pap, no DES exposure  Other Topics Concern   Not on file  Social History Narrative   Not on file   Social Drivers of Health   Financial Resource Strain: Not on file  Food Insecurity: Not on file  Transportation Needs: Not on file  Physical Activity: Not on file  Stress: Not on file  Social Connections: Not on file  Intimate Partner Violence: Not on file    Review of Systems  All other systems reviewed and are negative.   PHYSICAL EXAMINATION:   BP 134/84 (BP Location: Left Arm, Patient Position: Sitting, Cuff Size: Small)   Pulse (!) 102   Ht 5' 5.75" (1.67 m)   Wt 214 lb (97.1 kg)   LMP 09/21/2011   SpO2 96%   BMI 34.80 kg/m     General appearance: alert, cooperative and appears stated age Breasts: normal appearance, no masses or tenderness, No nipple retraction or dimpling, No nipple discharge or bleeding, No axillary or supraclavicular adenopathy.    Chaperone was present for exam:  Warren Lacy, CMA  ASSESSMENT:  Left axillary swelling.  Normal diagnostic breast imaging.  Hx lipomas. Vaginal atrophy.   PLAN:  Breast imaging reports reviewed.  Will place referral to general surgeon for further evaluation:  Dr. Carolynne Edouard.   May consider vaginal estrogen treatment after evaluation is complete.

## 2023-06-20 ENCOUNTER — Ambulatory Visit: Payer: Medicare Other | Admitting: Obstetrics and Gynecology

## 2023-06-20 ENCOUNTER — Encounter: Payer: Self-pay | Admitting: Obstetrics and Gynecology

## 2023-06-20 VITALS — BP 134/84 | HR 102 | Ht 65.75 in | Wt 214.0 lb

## 2023-06-20 DIAGNOSIS — N952 Postmenopausal atrophic vaginitis: Secondary | ICD-10-CM

## 2023-06-20 DIAGNOSIS — M7989 Other specified soft tissue disorders: Secondary | ICD-10-CM | POA: Diagnosis not present

## 2023-07-06 ENCOUNTER — Encounter: Payer: Self-pay | Admitting: General Surgery

## 2023-07-06 ENCOUNTER — Other Ambulatory Visit: Payer: Self-pay | Admitting: General Surgery

## 2023-07-06 DIAGNOSIS — D1720 Benign lipomatous neoplasm of skin and subcutaneous tissue of unspecified limb: Secondary | ICD-10-CM

## 2023-07-20 ENCOUNTER — Ambulatory Visit
Admission: RE | Admit: 2023-07-20 | Discharge: 2023-07-20 | Disposition: A | Discharge status: Home / Self Care | Payer: Medicare Other | Source: Ambulatory Visit | Attending: General Surgery | Admitting: General Surgery

## 2023-07-20 DIAGNOSIS — D1720 Benign lipomatous neoplasm of skin and subcutaneous tissue of unspecified limb: Secondary | ICD-10-CM

## 2023-07-20 MED ORDER — IOPAMIDOL (ISOVUE-300) INJECTION 61%
100.0000 mL | Freq: Once | INTRAVENOUS | Status: AC | PRN
Start: 2023-07-20 — End: 2023-07-20
  Administered 2023-07-20: 100 mL via INTRAVENOUS

## 2023-07-29 ENCOUNTER — Encounter: Payer: Self-pay | Admitting: General Surgery
